# Patient Record
Sex: Female | Born: 1990 | Race: White | Hispanic: No | Marital: Married | Smoking: Never smoker
Health system: Southern US, Community
[De-identification: ages and names within clinical notes are randomized; demographics above are authoritative.]

## PROBLEM LIST (undated history)

## (undated) DIAGNOSIS — O09299 Supervision of pregnancy with other poor reproductive or obstetric history, unspecified trimester: Secondary | ICD-10-CM

## (undated) DIAGNOSIS — E7800 Pure hypercholesterolemia, unspecified: Secondary | ICD-10-CM

## (undated) DIAGNOSIS — Z8489 Family history of other specified conditions: Secondary | ICD-10-CM

## (undated) DIAGNOSIS — F419 Anxiety disorder, unspecified: Secondary | ICD-10-CM

## (undated) DIAGNOSIS — K219 Gastro-esophageal reflux disease without esophagitis: Secondary | ICD-10-CM

## (undated) HISTORY — PX: APPENDECTOMY: SHX54

## (undated) HISTORY — PX: FRACTURE SURGERY: SHX138

---
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
## 2013-10-05 DIAGNOSIS — F411 Generalized anxiety disorder: Secondary | ICD-10-CM | POA: Insufficient documentation

## 2014-01-30 DIAGNOSIS — F314 Bipolar disorder, current episode depressed, severe, without psychotic features: Secondary | ICD-10-CM | POA: Insufficient documentation

## 2015-06-26 ENCOUNTER — Encounter (HOSPITAL_COMMUNITY): Payer: Self-pay | Admitting: *Deleted

## 2015-06-26 ENCOUNTER — Emergency Department (HOSPITAL_COMMUNITY)
Admission: EM | Admit: 2015-06-26 | Discharge: 2015-06-26 | Disposition: A | Discharge status: Home / Self Care | Payer: Self-pay | Attending: Emergency Medicine | Admitting: Emergency Medicine

## 2015-06-26 DIAGNOSIS — L252 Unspecified contact dermatitis due to dyes: Secondary | ICD-10-CM | POA: Insufficient documentation

## 2015-06-26 DIAGNOSIS — Z77098 Contact with and (suspected) exposure to other hazardous, chiefly nonmedicinal, chemicals: Secondary | ICD-10-CM

## 2015-06-26 DIAGNOSIS — Z3202 Encounter for pregnancy test, result negative: Secondary | ICD-10-CM | POA: Insufficient documentation

## 2015-06-26 DIAGNOSIS — N760 Acute vaginitis: Secondary | ICD-10-CM | POA: Insufficient documentation

## 2015-06-26 DIAGNOSIS — B9689 Other specified bacterial agents as the cause of diseases classified elsewhere: Secondary | ICD-10-CM

## 2015-06-26 DIAGNOSIS — R200 Anesthesia of skin: Secondary | ICD-10-CM | POA: Insufficient documentation

## 2015-06-26 DIAGNOSIS — Z8639 Personal history of other endocrine, nutritional and metabolic disease: Secondary | ICD-10-CM | POA: Insufficient documentation

## 2015-06-26 DIAGNOSIS — Z8659 Personal history of other mental and behavioral disorders: Secondary | ICD-10-CM | POA: Insufficient documentation

## 2015-06-26 HISTORY — DX: Pure hypercholesterolemia, unspecified: E78.00

## 2015-06-26 HISTORY — DX: Anxiety disorder, unspecified: F41.9

## 2015-06-26 LAB — COMPREHENSIVE METABOLIC PANEL
ALT: 12 U/L — ABNORMAL LOW (ref 14–54)
ANION GAP: 11 (ref 5–15)
AST: 14 U/L — ABNORMAL LOW (ref 15–41)
Albumin: 4.8 g/dL (ref 3.5–5.0)
Alkaline Phosphatase: 47 U/L (ref 38–126)
BUN: 10 mg/dL (ref 6–20)
CALCIUM: 9.6 mg/dL (ref 8.9–10.3)
CHLORIDE: 106 mmol/L (ref 101–111)
CO2: 22 mmol/L (ref 22–32)
Creatinine, Ser: 0.72 mg/dL (ref 0.44–1.00)
GFR calc non Af Amer: >60 mL/min (ref >60)
Glucose, Bld: 109 mg/dL — ABNORMAL HIGH (ref 65–99)
POTASSIUM: 3.6 mmol/L (ref 3.5–5.1)
SODIUM: 139 mmol/L (ref 135–145)
Total Bilirubin: 1.3 mg/dL — ABNORMAL HIGH (ref 0.3–1.2)
Total Protein: 7.1 g/dL (ref 6.5–8.1)

## 2015-06-26 LAB — WET PREP, GENITAL
SPERM: NONE SEEN
TRICH WET PREP: NONE SEEN
Yeast Wet Prep HPF POC: NONE SEEN

## 2015-06-26 LAB — CBC
HCT: 40.6 % (ref 36.0–46.0)
Hemoglobin: 13.7 g/dL (ref 12.0–15.0)
MCH: 31.4 pg (ref 26.0–34.0)
MCHC: 33.7 g/dL (ref 30.0–36.0)
MCV: 92.9 fL (ref 78.0–100.0)
PLATELETS: 216 10*3/uL (ref 150–400)
RBC: 4.37 MIL/uL (ref 3.87–5.11)
RDW: 12.4 % (ref 11.5–15.5)
WBC: 11.1 10*3/uL — AB (ref 4.0–10.5)

## 2015-06-26 LAB — URINALYSIS, ROUTINE W REFLEX MICROSCOPIC
Bilirubin Urine: NEGATIVE
Glucose, UA: NEGATIVE mg/dL
Hgb urine dipstick: NEGATIVE
KETONES UR: NEGATIVE mg/dL
LEUKOCYTES UA: NEGATIVE
NITRITE: NEGATIVE
PROTEIN: NEGATIVE mg/dL
Specific Gravity, Urine: 1.007 (ref 1.005–1.030)
pH: 6.5 (ref 5.0–8.0)

## 2015-06-26 LAB — I-STAT BETA HCG BLOOD, ED (MC, WL, AP ONLY)

## 2015-06-26 MED ORDER — METRONIDAZOLE 500 MG PO TABS: 500.0000 mg | ORAL_TABLET | Freq: Two times a day (BID) | ORAL | Status: DC

## 2015-06-26 NOTE — ED Provider Notes (Signed)
CSN: [id number]     Arrival date & time 06/26/15  1526 History   First MD Initiated Contact with Patient 06/26/15 1537     Chief Complaint  Patient presents with  . Dizziness  . Numbness     (Consider location/radiation/quality/duration/timing/severity/associated sxs/prior Treatment) Patient is a 24 y.o. female presenting with dizziness. The history is provided by the patient. No language interpreter was used.  Dizziness Quality:  Lightheadedness Severity:  Moderate Onset quality:  Gradual Timing:  Constant Progression:  Worsening Chronicity:  New Context: not with head movement   Relieved by:  Nothing Worsened by:  Nothing Ineffective treatments:  None tried Associated symptoms: no chest pain, no headaches and no shortness of breath   Risk factors: no anemia   Pt has a history of anxiety.  Pt recently started on 2 new medications.  Pt reports today her hands are blue and numb.    Past Medical History  Diagnosis Date  . Anxiety   . High cholesterol    History reviewed. No pertinent past surgical history. History reviewed. No pertinent family history. Social History  Substance Use Topics  . Smoking status: Never Smoker   . Smokeless tobacco: None  . Alcohol Use: No   OB History    No data available     Review of Systems  Respiratory: Negative for shortness of breath.   Cardiovascular: Negative for chest pain.  Neurological: Positive for dizziness. Negative for headaches.  All other systems reviewed and are negative.     Allergies  Review of patient's allergies indicates no known allergies.  Home Medications   Prior to Admission medications   Not on File   BP 129/77 mmHg  Pulse 110  Temp(Src) 98.4 F (36.9 C) (Oral)  Resp 18  Ht  (1.727 m)  Wt 61.689 kg  BMI 20.68 kg/m2  SpO2 100%  LMP 06/19/2015 Physical Exam  Constitutional: She is oriented to person, place, and time. She appears well-developed and well-nourished.  HENT:  Head:  Normocephalic and atraumatic.  Right Ear: External ear normal.  Left Ear: External ear normal.  Nose: Nose normal.  Mouth/Throat: Oropharynx is clear and moist.  Eyes: Conjunctivae and EOM are normal. Pupils are equal, round, and reactive to light.  Neck: Normal range of motion.  Cardiovascular: Normal rate, regular rhythm and normal heart sounds.   Pulmonary/Chest: Effort normal and breath sounds normal.  Abdominal: She exhibits no distension.  Genitourinary: Uterus normal. Vaginal discharge found.  White discharge  Musculoskeletal: Normal range of motion.  Neurological: She is alert and oriented to person, place, and time.  Skin: Skin is warm.  Blue ink removed with alcohol swab,   Matches indigo  Color of blue jeans  Psychiatric: She has a normal mood and affect.  Nursing note and vitals reviewed.   ED Course  Pt also ask to be checked for yeast.   Procedures (including critical care time) Labs Review Labs Reviewed  CBC - Abnormal; Notable for the following:    WBC 11.1 (*)    All other components within normal limits  URINALYSIS, ROUTINE W REFLEX MICROSCOPIC (NOT AT [facility])  COMPREHENSIVE METABOLIC PANEL  I-STAT BETA HCG BLOOD, ED ([facility], [facility], [facility] ONLY)    Imaging Review No results found. I have personally reviewed and evaluated these images and lab results as part of my medical decision-making.   EKG Interpretation None      MDM   Final diagnoses:  Contact with dyes  BV (bacterial vaginosis)  Flagyl Pt advised to follow up with her Physician for recheck.      Lonia SkinnerLeslie K East Rocky HillSofia, PA-C 06/26/15 Ernestina [id number]  Lyndal Pulleyaniel Knott, MD 06/27/15 [phone]

## 2015-06-26 NOTE — ED Notes (Signed)
Patient is wanting to know if she can be checked for a yeast infection, because she is concerned about her continuous urinary symptoms.  Will make PA aware.

## 2015-06-26 NOTE — ED Notes (Signed)
Pt reports feeling weak and lightheaded for extended amount of time, pt was recently feeling bad and went to pcp and started on two medications for anxiety. Reports today having numbness to bilateral hands and now hands appear discolored but warm to touch.

## 2015-06-26 NOTE — ED Notes (Addendum)
Patient up to restroom.  Gait steady and even.   

## 2015-06-26 NOTE — ED Notes (Signed)
Patient able to dress and ambulate independently 

## 2015-06-26 NOTE — Discharge Instructions (Signed)

## 2020-11-04 ENCOUNTER — Other Ambulatory Visit: Payer: Self-pay | Admitting: Certified Nurse Midwife

## 2020-11-04 MED ORDER — DOXYLAMINE-PYRIDOXINE 10-10 MG PO TBEC: 1.0000 | DELAYED_RELEASE_TABLET | Freq: Four times a day (QID) | ORAL | 5 refills | Status: DC

## 2020-11-04 NOTE — Progress Notes (Signed)
Pt c/o nausea and is requesting medication to help manage. Orders placed.   Doreene Burke, CNM

## 2020-11-19 ENCOUNTER — Encounter: Payer: Self-pay | Admitting: Certified Nurse Midwife

## 2020-11-19 ENCOUNTER — Ambulatory Visit (INDEPENDENT_AMBULATORY_CARE_PROVIDER_SITE_OTHER): Payer: 59 | Admitting: Certified Nurse Midwife

## 2020-11-19 ENCOUNTER — Other Ambulatory Visit: Payer: Self-pay

## 2020-11-19 VITALS — BP 112/65 | HR 103 | Resp 16 | Ht 68.0 in | Wt 144.0 lb

## 2020-11-19 DIAGNOSIS — Z3402 Encounter for supervision of normal first pregnancy, second trimester: Secondary | ICD-10-CM

## 2020-11-19 DIAGNOSIS — Z3A22 22 weeks gestation of pregnancy: Secondary | ICD-10-CM | POA: Diagnosis not present

## 2020-11-19 DIAGNOSIS — Z3200 Encounter for pregnancy test, result unknown: Secondary | ICD-10-CM

## 2020-11-19 LAB — POCT URINALYSIS DIPSTICK OB
Appearance: NEGATIVE
Bilirubin, UA: NEGATIVE
Blood, UA: NEGATIVE
Glucose, UA: NEGATIVE
Ketones, UA: NEGATIVE
Leukocytes, UA: NEGATIVE
Nitrite, UA: NEGATIVE
POC,PROTEIN,UA: NEGATIVE
Spec Grav, UA: 1.01 (ref 1.010–1.025)
Urobilinogen, UA: 0.2 E.U./dL
pH, UA: 7 (ref 5.0–8.0)

## 2020-11-19 LAB — POCT URINE PREGNANCY: Preg Test, Ur: POSITIVE — AB

## 2020-11-19 MED ORDER — ONDANSETRON 4 MG PO TBDP: 4.0000 mg | ORAL_TABLET | Freq: Three times a day (TID) | ORAL | 0 refills | Status: DC | PRN

## 2020-11-19 NOTE — Patient Instructions (Signed)
https://www.[employer].org/womens-health/faqs/prenatal-genetic-screening-tests">  Prenatal Care Prenatal care is health care during pregnancy. It helps you and your unborn baby (fetus) stay as healthy as possible. Prenatal care may be provided by a midwife, a family practice doctor, a mid-level practitioner (nurse practitioner or physician assistant), or a childbirth and pregnancy doctor (obstetrician). How does this affect me? During pregnancy, you will be closely monitored for any new conditions that might develop. To lower your risk of pregnancy complications, you and your health care provider will talk about any underlying conditions you have. How does this affect my baby? Early and consistent prenatal care increases the chance that your baby will be healthy during pregnancy. Prenatal care lowers the risk that your baby will be:  Born early (prematurely).  Smaller than expected at birth (small for gestational age). What can I expect at the first prenatal care visit? Your first prenatal care visit will likely be the longest. You should schedule your first prenatal care visit as soon as you know that you are pregnant. Your first visit is a good time to talk about any questions or concerns you have about pregnancy. Medical history At your visit, you and your health care provider will talk about your medical history, including:  Any past pregnancies.  Your family's medical history.  Medical history of the baby's father.  Any long-term (chronic) health conditions you have and how you manage them.  Any surgeries or procedures you have had.  Any current over-the-counter or prescription medicines, herbs, or supplements that you are taking.  Other factors that could pose a risk to your baby, including: ? Exposure to harmful chemicals or radiation at work or at home. ? Any substance use, including tobacco, alcohol, and drug use.  Your home setting and your stress levels, including: ? Exposure to  abuse or violence. ? Household financial strain.  Your daily health habits, including diet and exercise. Tests and screenings Your health care provider will:  Measure your weight, height, and blood pressure.  Do a physical exam, including a pelvic and breast exam.  Perform blood tests and urine tests to check for: ? Urinary tract infection. ? Sexually transmitted infections (STIs). ? Low iron levels in your blood (anemia). ? Blood type and certain proteins on red blood cells (Rh antibodies). ? Infections and immunity to viruses, such as hepatitis B and rubella. ? HIV (human immunodeficiency virus).  Discuss your options for genetic screening. Tips about staying healthy Your health care provider will also give you information about how to keep yourself and your baby healthy, including:  Nutrition and taking vitamins.  Physical activity.  How to manage pregnancy symptoms such as nausea and vomiting (morning sickness).  Infections and substances that may be harmful to your baby and how to avoid them.  Food safety.  Dental care.  Working.  Travel.  Warning signs to watch for and when to call your health care provider. How often will I have prenatal care visits? After your first prenatal care visit, you will have regular visits throughout your pregnancy. The visit schedule is often as follows:  Up to week 28 of pregnancy: once every 4 weeks.  28-36 weeks: once every 2 weeks.  After 36 weeks: every week until delivery. Some women may have visits more or less often depending on any underlying health conditions and the health of the baby. Keep all follow-up and prenatal care visits. This is important. What happens during routine prenatal care visits? Your health care provider will:  Measure your weight   and blood pressure.  Check for fetal heart sounds.  Measure the height of your uterus in your abdomen (fundal height). This may be measured starting around week 20 of  pregnancy.  Check the position of your baby inside your uterus.  Ask questions about your diet, sleeping patterns, and whether you can feel the baby move.  Review warning signs to watch for and signs of labor.  Ask about any pregnancy symptoms you are having and how you are dealing with them. Symptoms may include: ? Headaches. ? Nausea and vomiting. ? Vaginal discharge. ? Swelling. ? Fatigue. ? Constipation. ? Changes in your vision. ? Feeling persistently sad or anxious. ? Any discomfort, including back or pelvic pain. ? Bleeding or spotting. Make a list of questions to ask your health care provider at your routine visits.   What tests might I have during prenatal care visits? You may have blood, urine, and imaging tests throughout your pregnancy, such as:  Urine tests to check for glucose, protein, or signs of infection.  Glucose tests to check for a form of diabetes that can develop during pregnancy (gestational diabetes mellitus). This is usually done around week 24 of pregnancy.  Ultrasounds to check your baby's growth and development, to check for birth defects, and to check your baby's well-being. These can also help to decide when you should deliver your baby.  A test to check for group B strep (GBS) infection. This is usually done around week 36 of pregnancy.  Genetic testing. This may include blood, fluid, or tissue sampling, or imaging tests, such as an ultrasound. Some genetic tests are done during the first trimester and some are done during the second trimester. What else can I expect during prenatal care visits? Your health care provider may recommend getting certain vaccines during pregnancy. These may include:  A yearly flu shot (annual influenza vaccine). This is especially important if you will be pregnant during flu season.  Tdap (tetanus, diphtheria, pertussis) vaccine. Getting this vaccine during pregnancy can protect your baby from whooping cough  (pertussis) after birth. This vaccine may be recommended between weeks 27 and 36 of pregnancy.  A COVID-19 vaccine. Later in your pregnancy, your health care provider may give you information about:  Childbirth and breastfeeding classes.  Choosing a health care provider for your baby.  Umbilical cord banking.  Breastfeeding.  Birth control after your baby is born.  The hospital labor and delivery unit and how to set up a tour.  Registering at the hospital before you go into labor. Where to find more information  Office on Women's Health: womenshealth.gov  American Pregnancy Association: americanpregnancy.org  March of Dimes: marchofdimes.org Summary  Prenatal care helps you and your baby stay as healthy as possible during pregnancy.  Your first prenatal care visit will most likely be the longest.  You will have visits and tests throughout your pregnancy to monitor your health and your baby's health.  Bring a list of questions to your visits to ask your health care provider.  Make sure to keep all follow-up and prenatal care visits. This information is not intended to replace advice given to you by your health care provider. Make sure you discuss any questions you have with your health care provider. Document Revised: 04/04/2020 Document Reviewed: 04/04/2020 Elsevier Patient Education  2021 Elsevier Inc.  

## 2020-11-19 NOTE — Progress Notes (Signed)
Subjective:    Krista Hancock is a 30 y.o. female who presents for evaluation of amenorrhea. She believes she could be pregnant. Pregnancy is desired. Sexual Activity: single partner, contraception: none. Current symptoms also include: nausea. Last period was normal.   Patient's last menstrual period was 09/23/2020 (exact date). The following portions of the patient's history were reviewed and updated as appropriate: allergies, current medications, past family history, past medical history, past social history, past surgical history and problem list.  Review of Systems Pertinent items are noted in HPI.     Objective:    BP 112/65   Pulse (!) 103   Resp 16   Ht 5\' 8"  (1.727 m)   Wt 144 lb (65.3 kg)   LMP 09/23/2020 (Exact Date)   BMI 21.90 kg/m  General: alert, cooperative, appears stated age, no distress and no acute distress    Lab Review Urine HCG: positive    Assessment:    Absence of menstruation.     Plan:   Positive: EDC: 06/30/21 Briefly discussed pre-natal care options. MD and midwifery care reviewed. Desires to see midwives. Encouraged well-balanced diet, plenty of rest when needed, pre-natal vitamins daily and walking for exercise. Discussed self-help for nausea, avoiding OTC medications until consulting provider or pharmacist, other than Tylenol as needed, minimal caffeine (1-2 cups daily) and avoiding alcohol. She will schedule her nurse visit in 2 wks, her initial NOB visit in 4 wks. Orders placed for zofran for nausea, pt states diclegis not working.   07/02/21, CNM

## 2020-12-11 ENCOUNTER — Other Ambulatory Visit: Payer: Self-pay

## 2020-12-11 ENCOUNTER — Ambulatory Visit
Admission: RE | Admit: 2020-12-11 | Discharge: 2020-12-11 | Disposition: A | Discharge status: Home / Self Care | Payer: Self-pay | Source: Ambulatory Visit | Attending: Certified Nurse Midwife | Admitting: Certified Nurse Midwife

## 2020-12-11 DIAGNOSIS — Z3200 Encounter for pregnancy test, result unknown: Secondary | ICD-10-CM | POA: Insufficient documentation

## 2020-12-12 ENCOUNTER — Ambulatory Visit (INDEPENDENT_AMBULATORY_CARE_PROVIDER_SITE_OTHER): Payer: 59

## 2020-12-12 VITALS — BP 134/72 | HR 101 | Ht 68.0 in | Wt 147.7 lb

## 2020-12-12 DIAGNOSIS — Z0283 Encounter for blood-alcohol and blood-drug test: Secondary | ICD-10-CM | POA: Diagnosis not present

## 2020-12-12 DIAGNOSIS — Z3481 Encounter for supervision of other normal pregnancy, first trimester: Secondary | ICD-10-CM | POA: Diagnosis not present

## 2020-12-12 DIAGNOSIS — Z113 Encounter for screening for infections with a predominantly sexual mode of transmission: Secondary | ICD-10-CM | POA: Diagnosis not present

## 2020-12-12 DIAGNOSIS — Z3A11 11 weeks gestation of pregnancy: Secondary | ICD-10-CM

## 2020-12-12 NOTE — Progress Notes (Signed)
      Krista Hancock presents for NOB nurse intake visit. Pregnancy confirmation done at [facility], 11/19/2020, with Doreene Burke.  G 2.  P 1001.  LMP 09/23/2020.  EDD 06/30/2021.  [place] [redacted]w[redacted]d. Pregnancy education material explained and given.  0 cats/ 2 dogs in the home.  NOB labs ordered. BMI less than 30. TSH/HbgA1c not ordered. Sickle cell not ordered due to race. HIV and drug screen explained and ordered. Genetic screening discussed. Genetic testing: pt is aware that genetic testing will be completed during the NOB PE.  Pt to discuss genetic testing with provider. PNV encouraged. Pt to follow up with provider in 1 weeks for NOB physical.     Pt wants to get the MaterniT21 done at [facility] PE with provider.

## 2020-12-12 NOTE — Patient Instructions (Signed)
WHAT OB PATIENTS CAN EXPECT  Confirmation of pregnancy and ultrasound ordered if medically indicated-[redacted] weeks gestation New OB (NOB) intake with nurse and New OB (NOB) labs- [redacted] weeks gestation New OB (NOB) physical examination with provider- 11/[redacted] weeks gestation Flu vaccine-[redacted] weeks gestation Anatomy scan-[redacted] weeks gestation Glucose tolerance test, blood work to test for anemia, T-dap vaccine-[redacted] weeks gestation Vaginal swabs/cultures-STD/Group B strep-[redacted] weeks gestation Appointments every 4 weeks until 28 weeks Every 2 weeks from 28 weeks until 36 weeks Weekly visits from 36 weeks until delivery  Tests and Screening During Pregnancy Having certain tests and screenings during pregnancy is an important part of your prenatal care. These tests help your health care provider find problems that might affect your pregnancy. Some tests must be done for all pregnant women, and some are optional. Most of the tests and screenings do not pose any risks for you or your baby. You may need additional testing if any routine tests indicate a problem. Tests and screenings done early in pregnancy Some tests and screenings you can expect to have in early pregnancy include: Blood tests, such as: Complete blood count (CBC). This test is done to check your red and white blood cells. It can help identify a risk for anemia, infection, or bleeding. Blood typing. This test shows your blood type. It also shows whether you have a certain protein in your red blood cells called the Rh factor. It can be dangerous for your baby if you do not have this protein (Rh negative) and your baby has it (Rh positive). Tests to check for diseases that can cause birth defects or can be passed to your baby, such as: [place] measles (rubella) and chicken pox. The test indicates whether you are immune to these diseases. Hepatitis B and C. Human Immunodeficiency Virus (HIV). Syphilis. Zika virus, if you or your partner has traveled to an area  where the virus occurs. Urine testing. This checks for sugar in your urine and for signs of infection. Blood pressure. This is to check for high blood pressure and preeclampsia. Testing for sexually transmitted infections (STIs), such as chlamydia or gonorrhea. Testing for tuberculosis. You may have this skin test if you are at risk for tuberculosis. Fetal ultrasound. This is an imaging study of your growing baby. It uses sound waves to create pictures of your baby. This test may be done to help determine your due date and to ensure you do not have an ectopic pregnancy. An ectopic pregnancy is a pregnancy that grows outside of the uterus.   Tests and screenings done later in pregnancy Certain tests are done for the first time later in the pregnancy. Some of the tests that were done in early pregnancy are repeated at this time. Some common tests you can expect to have later in pregnancy include: Rh antibody testing. If you are Rh negative, you will have a blood test at about 28 weeks of pregnancy to see if you are producing Rh antibodies. If you have not started to make antibodies, you will be given an injection to prevent you from making antibodies for the rest of your pregnancy. Glucose screening. This checks your blood sugar. It will show whether you are developing the type of diabetes that occurs during pregnancy (gestational diabetes). You may have this screening earlier if you have risk factors for diabetes. Screening for group B streptococcus (GBS). GBS is a type of bacteria that may live in your rectum or vagina. GBS can spread to your baby  during birth. This is done at 35-37 weeks of pregnancy. If testing is positive for GBS, you may be treated with antibiotic medicine. Urine and blood tests to monitor for other pregnancy problems, such as preeclampsia or anemia. Blood pressure to monitor for high blood pressure and preeclampsia. Fetal ultrasound. This may be repeated at 16-20 weeks to check how  your baby is growing and developing. Non-stress test. This test is done later in pregnancy to check your baby's heart rate. This may be repeated weekly if your pregnancy is high risk. Biophysical profile. This test includes ultrasound imaging and a non-stress test to ensure your baby is healthy. This test may help decide when your baby should be born.   Screening for birth defects Some birth defects are caused by abnormal genes passed down through families. Early in your pregnancy, tests can be done to find out if your baby is at risk for a genetic disorder. This testing is optional. The type of testing recommended for you will depend on your family and medical history, your ethnicity, and your age. Testing may include: Screening tests. These tests may include an ultrasound, blood tests, or a combination of both. The blood tests are used to check for abnormal genes and the ultrasound is done to look for early birth defects. Carrier screening. This test involves checking the blood or saliva of both parents to see if they carry abnormal genes that could be passed down to a baby. If genetic screening shows that your baby is at risk for a genetic defect, additional diagnostic testing may be recommended, such as: Amniocentesis. This involves testing a sample of fluid from your womb (amniotic fluid). Chorionic villus sampling. In this test, a sample of cells from your placenta is checked for abnormal cells. Unlike other tests done during pregnancy, diagnostic testing does have some risk for your pregnancy. Talk to your health care provider about the risks and benefits of genetic testing. Questions to ask your health care provider What routine tests are recommended for me? When and how will these tests be done? When will I get the results of routine tests? What do the results of these tests mean for me or my baby? Do you recommend any genetic screening tests? Which ones? Should I see a genetic counselor  before having genetic screening? Where to find more information American Pregnancy Association: americanpregnancy.org/prenatal-testing [employer]nec[employer]: JewelryExec.com.pt Office on [employer] Health: KeywordPortfolios.com.br March of Dimes: marchofdimes.org/pregnancy Summary Having certain tests and screenings during pregnancy is an important part of your prenatal care. Talk to your health care provider about what tests are right for you and your baby. In early pregnancy, testing may be done to check your risks for various conditions that can affect you and your baby. Later in pregnancy, tests may be done to ensure that your baby is growing normally and that you and your baby are staying healthy during the pregnancy. Genetic testing is optional. Talk to your health care provider about the risks and benefits of genetic testing. This information is not intended to replace advice given to you by your health care provider. Make sure you discuss any questions you have with your health care provider. Document Revised: 03/12/2020 Document Reviewed: 03/12/2020 Elsevier Patient Education  2021 [employer]. Morning Sickness  Morning sickness is when you feel like you may vomit (feel nauseous) during pregnancy. Sometimes, you may vomit. Morning sickness most often happens in the morning, but it can also happen at any time of  the day. Some women may have morning sickness that makes them vomit all the time. This is a more serious problem that needs treatment. What are the causes? The cause of this condition is not known. What increases the risk? You had vomiting or a feeling like you may vomit before your pregnancy. You had morning sickness in another pregnancy. You are pregnant with more than one baby, such as twins. What are the signs or symptoms? Feeling like you may vomit. Vomiting. How is this treated? Treatment is usually not needed for this condition. You may only  need to change what you eat. In some cases, your doctor may give you some things to take for your condition. These include: Vitamin B6 supplements. Medicines to treat the feeling that you may vomit. Ginger. Follow these instructions at home: Medicines Take over-the-counter and prescription medicines only as told by your doctor. Do not take any medicines until you talk with your doctor about them first. Take multivitamins before you get pregnant. These can stop or lessen the symptoms of morning sickness. Eating and drinking Eat dry toast or crackers before getting out of bed. Eat 5 or 6 small meals a day. Eat dry and bland foods like rice and baked potatoes. Do not eat greasy, fatty, or spicy foods. Have someone cook for you if the smell of food causes you to vomit or to feel like you may vomit. If you feel like you may vomit after taking prenatal vitamins, take them at night or with a snack. Eat protein foods when you need a snack. Nuts, yogurt, and cheese are good choices. Drink fluids throughout the day. Try ginger ale made with real ginger, ginger tea made from fresh grated ginger, or ginger candies. General instructions Do not smoke or use any products that contain nicotine or tobacco. If you need help quitting, ask your doctor. Use an air purifier to keep the air in your house free of smells. Get lots of fresh air. Try to avoid smells that make you feel sick. Try wearing an acupressure wristband. This is a wristband that is used to treat seasickness. Try a treatment called acupuncture. In this treatment, a doctor puts needles into certain areas of your body to make you feel better. Contact a doctor if: You need medicine to feel better. You feel dizzy or light-headed. You are losing weight. Get help right away if: The feeling that you may vomit will not go away, or you cannot stop vomiting. You faint. You have very bad pain in your belly. Summary Morning sickness is when you  feel like you may vomit (feel nauseous) during pregnancy. You may feel sick in the morning, but you can feel this way at any time of the day. Making some changes to what you eat may help your symptoms go away. This information is not intended to replace advice given to you by your health care provider. Make sure you discuss any questions you have with your health care provider. Document Revised: 02/05/2020 Document Reviewed: 01/15/2020 Elsevier Patient Education  2021 [employer]. How a Baby Grows During Pregnancy Pregnancy begins when a female's sperm enters a female's egg. This is called fertilization. Fertilization usually happens in one of the fallopian tubes that connect the ovaries to the uterus. The fertilized egg moves down the fallopian tube to the uterus. Once it reaches the uterus, it implants into the lining of the uterus and begins to grow. For the first 8 weeks, the fertilized egg is called an  embryo. After 8 weeks, it is called a fetus. As the fetus continues to grow, it receives oxygen and nutrients through the placenta, which is an organ that grows to support the developing baby. The placenta is the life support system for the baby. It provides oxygen and nutrition and removes waste. How long does a typical pregnancy last? A pregnancy usually lasts 280 days, or about 40 weeks. Pregnancy is divided into three periods of growth, also called trimesters: First trimester: 0-12 weeks. Second trimester: 13-27 weeks. Third trimester: 28-40 weeks. The day when your baby is ready to be born (full term) is your estimated date of delivery. However, most babies are not born on their estimated date of delivery. How does my baby develop month by month? First month The fertilized egg attaches to the inside of the uterus. Some cells will form the placenta. Others will form the fetus. The arms, legs, brain, spinal cord, lungs, and heart begin to develop. At the end of the first month, the heart  begins to beat. Second month The bones, inner ear, eyelids, hands, and feet form. The genitals develop. By the end of 8 weeks, all major organs are developing. Third month All of the internal organs are forming. Teeth develop below the gums. Bones and muscles begin to grow. The spine can flex. The skin is transparent. Fingernails and toenails begin to form. Arms and legs continue to grow longer, and hands and feet develop. The fetus is about 3 inches (7.6 cm) long. Fourth month The placenta is completely formed. The external sex organs, neck, outer ear, eyebrows, eyelids, and fingernails are formed. The fetus can hear, swallow, and move its arms and legs. The kidneys begin to produce urine. The skin is covered with a white, waxy coating (vernix) and very fine hair (lanugo). Fifth month The fetus moves around more and can be felt for the first time (quickening). The fetus starts to sleep and wake up and may begin to suck a finger. The nails grow to the end of the fingers. The organ in the digestive system that makes bile (gallbladder) functions and helps to digest nutrients. If the fetus is a female, eggs are present in the ovaries. If the fetus is a female, testicles start to move down into the scrotum. Sixth month The lungs are formed. The eyes open. The brain continues to develop. Your baby has fingerprints and toe prints. Your baby's hair grows thicker. At the end of the second trimester, the fetus is about 9 inches (22.9 cm) long. Seventh month The fetus kicks and stretches. The eyes are developed enough to sense changes in light. The hands can make a grasping motion. The fetus responds to sound. Eighth month Most organs and body systems are fully developed and functioning. Bones harden, and taste buds develop. The fetus may hiccup. Certain areas of the brain are still developing. The skull remains soft. Ninth month The fetus gains about  lb (0.23 kg) each week. The lungs  are fully developed. Patterns of sleep develop. The fetus's head typically moves into a head-down position (vertex) in the uterus to prepare for birth. The fetus weighs 6-9 lb (2.72-4.08 kg) and is 19-20 inches (48.26-50.8 cm) long.   How do I know if my baby is developing well? Always talk with your health care provider about any concerns that you may have about your pregnancy and your baby. At each prenatal visit, your health care provider will do several different tests to check on your health  and keep track of your baby's development. These include: Fundal height and position. To do this, your health care provider will: Measure your growing belly from your pubic bone to the top of the uterus using a tape measure. Feel your belly to determine your baby's position. Heartbeat. An ultrasound in the first trimester can confirm pregnancy and show a heartbeat, depending on how far along you are. Your health care provider will check your baby's heart rate at every prenatal visit. You will also have a second trimester ultrasound to check your baby's development. Follow these instructions at home: Take prenatal vitamins as told by your health care provider. These include vitamins such as folic acid, iron, calcium, and vitamin D. They are important for healthy development. Take over-the-counter and prescription medicines only as told by your health care provider. Keep all follow-up visits. This is important. Follow-up visits include prenatal care and screening tests. Summary A pregnancy usually lasts 280 days, or about 40 weeks. Pregnancy is divided into three periods of growth, also called trimesters. Your health care provider will monitor your baby's growth and development throughout your pregnancy. Follow your health care provider's recommendations about taking prenatal vitamins and medicines during your pregnancy. Talk with your health care provider if you have any concerns about your pregnancy or  your developing baby. This information is not intended to replace advice given to you by your health care provider. Make sure you discuss any questions you have with your health care provider. Document Revised: 11/29/2019 Document Reviewed: 10/05/2019 Elsevier Patient Education  2021 [employer]. [email or web address].com.cy.html">  First Trimester of Pregnancy  The first trimester of pregnancy starts on the first day of your last menstrual period until the end of week 12. This is also called months 1 through 3 of pregnancy. Body changes during your first trimester Your body goes through many changes during pregnancy. The changes usually return to normal after your baby is born. Physical changes You may gain or lose weight. Your breasts may grow larger and hurt. The area around your nipples may get darker. Dark spots or blotches may develop on your face. You may have changes in your hair. Health changes You may feel like you might vomit (nauseous), and you may vomit. You may have heartburn. You may have headaches. You may have trouble pooping (constipation). Your gums may bleed. Other changes You may get tired easily. You may pee (urinate) more often. Your menstrual periods will stop. You may not feel hungry. You may want to eat certain kinds of food. You may have changes in your emotions from day to day. You may have more dreams. Follow these instructions at home: Medicines Take over-the-counter and prescription medicines only as told by your doctor. Some medicines are not safe during pregnancy. Take a prenatal vitamin that contains at least 600 micrograms (mcg) of folic acid. Eating and drinking Eat healthy meals that include: Fresh fruits and vegetables. Whole grains. Good sources of protein, such as meat, eggs, or tofu. Low-fat dairy products. Avoid raw meat and unpasteurized juice, milk, and cheese. If you feel like you may vomit, or you vomit: Eat 4 or 5  small meals a day instead of 3 large meals. Try eating a few soda crackers. Drink liquids between meals instead of during meals. You may need to take these actions to prevent or treat trouble pooping: Drink enough fluids to keep your pee (urine) pale yellow. Eat foods that are high in fiber. These include beans, whole grains, and fresh fruits  and vegetables. Limit foods that are high in fat and sugar. These include fried or sweet foods. Activity Exercise only as told by your doctor. Most people can do their usual exercise routine during pregnancy. Stop exercising if you have cramps or pain in your lower belly (abdomen) or low back. Do not exercise if it is too hot or too humid, or if you are in a place of great height (high altitude). Avoid heavy lifting. If you choose to, you may have sex unless your doctor tells you not to. Relieving pain and discomfort Wear a good support bra if your breasts are sore. Rest with your legs raised (elevated) if you have leg cramps or low back pain. If you have bulging veins (varicose veins) in your legs: Wear support hose as told by your doctor. Raise your feet for 15 minutes, 3-4 times a day. Limit salt in your food. Safety Wear your seat belt at all times when you are in a car. Talk with your doctor if someone is hurting you or yelling at you. Talk with your doctor if you are feeling sad or have thoughts of hurting yourself. Lifestyle Do not use hot tubs, steam rooms, or saunas. Do not douche. Do not use tampons or scented sanitary pads. Do not use herbal medicines, illegal drugs, or medicines that are not approved by your doctor. Do not drink alcohol. Do not smoke or use any products that contain nicotine or tobacco. If you need help quitting, ask your doctor. Avoid cat litter boxes and soil that is used by cats. These carry germs that can cause harm to the baby and can cause a loss of your baby by miscarriage or stillbirth. General  instructions Keep all follow-up visits. This is important. Ask for help if you need counseling or if you need help with nutrition. Your doctor can give you advice or tell you where to go for help. Visit your dentist. At home, brush your teeth with a soft toothbrush. Floss gently. Write down your questions. Take them to your prenatal visits. Where to find more information American Pregnancy Association: americanpregnancy.org [employer]ians [employer]: www.acog.org Office on Women's Health: KeywordPortfolios.com.br Contact a doctor if: You are dizzy. You have a fever. You have mild cramps or pressure in your lower belly. You have a nagging pain in your belly area. You continue to feel like you may vomit, you vomit, or you have watery poop (diarrhea) for 24 hours or longer. You have a bad-smelling fluid coming from your vagina. You have pain when you pee. You are exposed to a disease that spreads from person to person, such as chickenpox, measles, Zika virus, HIV, or hepatitis. Get help right away if: You have spotting or bleeding from your vagina. You have very bad belly cramping or pain. You have shortness of breath or chest pain. You have any kind of injury, such as from a fall or a car crash. You have new or increased pain, swelling, or redness in an arm or leg. Summary The first trimester of pregnancy starts on the first day of your last menstrual period until the end of week 12 (months 1 through 3). Eat 4 or 5 small meals a day instead of 3 large meals. Do not smoke or use any products that contain nicotine or tobacco. If you need help quitting, ask your doctor. Keep all follow-up visits. This information is not intended to replace advice given to you by your health care provider. Make sure you  discuss any questions you have with your health care provider. Document Revised: 11/29/2019 Document Reviewed: 10/05/2019 Elsevier Patient Education  2021  [place]. Commonly Asked Questions During Pregnancy  Cats: A parasite can be excreted in cat feces.  To avoid exposure you need to have another person empty the little box.  If you must empty the litter box you will need to wear gloves.  Wash your hands after handling your cat.  This parasite can also be found in raw or undercooked meat so this should also be avoided.  Colds, Sore Throats, Flu: Please check your medication sheet to see what you can take for symptoms.  If your symptoms are unrelieved by these medications please call the office.  Dental Work: Most any dental work Investment banker, corporate recommends is permitted.  X-rays should only be taken during the first trimester if absolutely necessary.  Your abdomen should be shielded with a lead apron during all x-rays.  Please notify your provider prior to receiving any x-rays.  Novocaine is fine; gas is not recommended.  If your dentist requires a note from [place] prior to dental work please call the office and we will provide one for you.  Exercise: Exercise is an important part of staying healthy during your pregnancy.  You may continue most exercises you were accustomed to prior to pregnancy.  Later in your pregnancy you will most likely notice you have difficulty with activities requiring balance like riding a bicycle.  It is important that you listen to your body and avoid activities that put you at a higher risk of falling.  Adequate rest and staying well hydrated are a must!  If you have questions about the safety of specific activities ask your provider.    Exposure to Children with illness: Try to avoid obvious exposure; report any symptoms to [place] when noted,  If you have chicken pos, red measles or mumps, you should be immune to these diseases.   Please do not take any vaccines while pregnant unless you have checked with your OB provider.  Fetal Movement: After 28 weeks we recommend you do "kick counts" twice daily.  Lie or sit down in a calm quiet  environment and count your baby movements "kicks".  You should feel your baby at least 10 times per hour.  If you have not felt 10 kicks within the first hour get up, walk around and have something sweet to eat or drink then repeat for an additional hour.  If count remains less than 10 per hour notify your provider.  Fumigating: Follow your pest control agent's advice as to how long to stay out of your home.  Ventilate the area well before re-entering.  Hemorrhoids:   Most over-the-counter preparations can be used during pregnancy.  Check your medication to see what is safe to use.  It is important to use a stool softener or fiber in your diet and to drink lots of liquids.  If hemorrhoids seem to be getting worse please call the office.   Hot Tubs:  Hot tubs Jacuzzis and saunas are not recommended while pregnant.  These increase your internal body temperature and should be avoided.  Intercourse:  Sexual intercourse is safe during pregnancy as long as you are comfortable, unless otherwise advised by your provider.  Spotting may occur after intercourse; report any bright red bleeding that is heavier than spotting.  Labor:  If you know that you are in labor, please go to the hospital.  If you are  unsure, please call the office and let us help you decide what to do.  Lifting, straining, etc:  If your job requires heavy lifting or straining please check with your provider for any limitations.  Generally, you should not lift items heavier than that you can lift simply with your hands and arms (no back muscles)  Painting:  Paint fumes do not harm your pregnancy, but may make you ill and should be avoided if possible.  Latex or water based paints have less odor than oils.  Use adequate ventilation while painting.  Permanents & Hair Color:  Chemicals in hair dyes are not recommended as they cause increase hair dryness which can increase hair loss during pregnancy.  " Highlighting" and permanents are allowed.   Dye may be absorbed differently and permanents may not hold as well during pregnancy.  Sunbathing:  Use a sunscreen, as skin burns easily during pregnancy.  Drink plenty of fluids; avoid over heating.  Tanning Beds:  Because their possible side effects are still unknown, tanning beds are not recommended.  Ultrasound Scans:  Routine ultrasounds are performed at approximately 20 weeks.  You will be able to see your baby's general anatomy an if you would like to know the gender this can usually be determined as well.  If it is questionable when you conceived you may also receive an ultrasound early in your pregnancy for dating purposes.  Otherwise ultrasound exams are not routinely performed unless there is a medical necessity.  Although you can request a scan we ask that you pay for it when conducted because insurance does not cover " patient request" scans.  Work: If your pregnancy proceeds without complications you may work until your due date, unless your physician or employer advises otherwise.  Round Ligament Pain/Pelvic Discomfort:  Sharp, shooting pains not associated with bleeding are fairly common, usually occurring in the second trimester of pregnancy.  They tend to be worse when standing up or when you remain standing for long periods of time.  These are the result of pressure of certain pelvic ligaments called "round ligaments".  Rest, Tylenol and heat seem to be the most effective relief.  As the womb and fetus grow, they rise out of the pelvis and the discomfort improves.  Please notify the office if your pain seems different than that described.  It may represent a more serious condition.  Common Medications Safe in Pregnancy  Acne:      Constipation:  Benzoyl Peroxide     Colace  Clindamycin      Dulcolax Suppository  Topica Erythromycin     Fibercon  Salicylic Acid      Metamucil         Miralax AVOID:        Senakot   Accutane    Cough:  Retin-A       Cough  Drops  Tetracycline      Phenergan w/ Codeine if Rx  Minocycline      Robitussin (Plain & DM)  Antibiotics:     Crabs/Lice:  Ceclor       RID  Cephalosporins    AVOID:  E-Mycins      Kwell  Keflex  Macrobid/Macrodantin   Diarrhea:  Penicillin      Kao-Pectate  Zithromax      Imodium AD         PUSH FLUIDS AVOID:       Cipro     Fever:  Tetracycline  Tylenol (Regular or Extra  Minocycline       Strength)  Levaquin      Extra Strength-Do not          Exceed 8 tabs/24 hrs Caffeine:        <293m/day (equiv. To 1 cup of coffee or  approx. 3 12 oz sodas)         Gas: Cold/Hayfever:       Gas-X  Benadryl      Mylicon  Claritin       Phazyme  **Claritin-D        Chlor-Trimeton    Headaches:  Dimetapp      ASA-Free Excedrin  Drixoral-Non-Drowsy     Cold Compress  Mucinex (Guaifenasin)     Tylenol (Regular or Extra  Sudafed/Sudafed-12 Hour     Strength)  **Sudafed PE Pseudoephedrine   Tylenol Cold & Sinus     Vicks Vapor Rub  Zyrtec  **AVOID if Problems With Blood Pressure         Heartburn: Avoid lying down for at least 1 hour after meals  Aciphex      Maalox     Rash:  Milk of Magnesia     Benadryl    Mylanta       1% Hydrocortisone Cream  Pepcid  Pepcid Complete   Sleep Aids:  Prevacid      Ambien   Prilosec       Benadryl  Rolaids       Chamomile Tea  Tums (Limit 4/day)     Unisom         Tylenol PM         Warm milk-add vanilla or  Hemorrhoids:       Sugar for taste  Anusol/Anusol H.C.  (RX: Analapram 2.5%)  Sugar Substitutes:  Hydrocortisone OTC     Ok in moderation  Preparation H      Tucks        Vaseline lotion applied to tissue with wiping    Herpes:     Throat:  Acyclovir      Oragel  Famvir  Valtrex     Vaccines:         Flu Shot Leg Cramps:       *Gardasil  Benadryl      Hepatitis A         Hepatitis B Nasal Spray:       Pneumovax  Saline Nasal Spray     Polio Booster         Tetanus Nausea:       Tuberculosis test or PPD  Vitamin  B6 25 mg TID   AVOID:    Dramamine      *Gardasil  Emetrol       Live Poliovirus  Ginger Root 250 mg QID    MMR (measles, mumps &  High Complex Carbs @ Bedtime    rebella)  Sea Bands-Accupressure    Varicella (Chickenpox)  Unisom 1/2 tab TID     *No known complications           If received before Pain:         Known pregnancy;   Darvocet       Resume series after  Lortab        Delivery  Percocet    Yeast:   Tramadol      Femstat  Tylenol 3      Gyne-lotrimin  Ultram       Monistat  Vicodin           MISC:         All Sunscreens           Hair Coloring/highlights          Insect Repellant's          (Including DEET)         Mystic Tans

## 2020-12-13 LAB — VIRAL HEPATITIS HBV, HCV
HCV Ab: <0.1 s/co ratio (ref 0.0–0.9)
Hep B Core Total Ab: NEGATIVE
Hep B Surface Ab, Qual: NONREACTIVE
Hepatitis B Surface Ag: NEGATIVE

## 2020-12-13 LAB — URINALYSIS, ROUTINE W REFLEX MICROSCOPIC
Bilirubin, UA: NEGATIVE
Glucose, UA: NEGATIVE
Ketones, UA: NEGATIVE
Leukocytes,UA: NEGATIVE
Nitrite, UA: NEGATIVE
RBC, UA: NEGATIVE
Specific Gravity, UA: 1.022 (ref 1.005–1.030)
Urobilinogen, Ur: 1 mg/dL (ref 0.2–1.0)
pH, UA: 6.5 (ref 5.0–7.5)

## 2020-12-13 LAB — DRUG PROFILE, UR, 9 DRUGS (LABCORP)
Amphetamines, Urine: NEGATIVE ng/mL
Barbiturate Quant, Ur: NEGATIVE ng/mL
Benzodiazepine Quant, Ur: NEGATIVE ng/mL
Cannabinoid Quant, Ur: NEGATIVE ng/mL
Cocaine (Metab.): NEGATIVE ng/mL
Methadone Screen, Urine: NEGATIVE ng/mL
Opiate Quant, Ur: NEGATIVE ng/mL
PCP Quant, Ur: NEGATIVE ng/mL
Propoxyphene: NEGATIVE ng/mL

## 2020-12-13 LAB — ABO AND RH: Rh Factor: POSITIVE

## 2020-12-13 LAB — NICOTINE SCREEN, URINE: Cotinine Ql Scrn, Ur: NEGATIVE ng/mL

## 2020-12-13 LAB — VARICELLA ZOSTER ANTIBODY, IGG: Varicella zoster IgG: 1141 index (ref >165)

## 2020-12-13 LAB — HIV ANTIBODY (ROUTINE TESTING W REFLEX): HIV Screen 4th Generation wRfx: NONREACTIVE

## 2020-12-13 LAB — ANTIBODY SCREEN: Antibody Screen: NEGATIVE

## 2020-12-13 LAB — RUBELLA SCREEN: Rubella Antibodies, IGG: 7.38 index (ref >0.99)

## 2020-12-13 LAB — RPR: RPR Ser Ql: NONREACTIVE

## 2020-12-13 LAB — HCV INTERPRETATION

## 2020-12-14 LAB — CULTURE, OB URINE

## 2020-12-14 LAB — GC/CHLAMYDIA PROBE AMP
Chlamydia trachomatis, NAA: NEGATIVE
Neisseria Gonorrhoeae by PCR: NEGATIVE

## 2020-12-14 LAB — URINE CULTURE, OB REFLEX

## 2020-12-17 ENCOUNTER — Ambulatory Visit (INDEPENDENT_AMBULATORY_CARE_PROVIDER_SITE_OTHER): Payer: 59 | Admitting: Certified Nurse Midwife

## 2020-12-17 ENCOUNTER — Encounter: Payer: Self-pay | Admitting: Certified Nurse Midwife

## 2020-12-17 ENCOUNTER — Other Ambulatory Visit: Payer: Self-pay | Admitting: Certified Nurse Midwife

## 2020-12-17 ENCOUNTER — Other Ambulatory Visit: Payer: Self-pay

## 2020-12-17 ENCOUNTER — Other Ambulatory Visit (HOSPITAL_COMMUNITY)
Admission: RE | Admit: 2020-12-17 | Discharge: 2020-12-17 | Disposition: A | Discharge status: Home / Self Care | Payer: 59 | Source: Ambulatory Visit | Attending: Certified Nurse Midwife | Admitting: Certified Nurse Midwife

## 2020-12-17 DIAGNOSIS — Z124 Encounter for screening for malignant neoplasm of cervix: Secondary | ICD-10-CM | POA: Diagnosis present

## 2020-12-17 DIAGNOSIS — Z3481 Encounter for supervision of other normal pregnancy, first trimester: Secondary | ICD-10-CM

## 2020-12-17 DIAGNOSIS — Z3A12 12 weeks gestation of pregnancy: Secondary | ICD-10-CM

## 2020-12-17 LAB — POCT URINALYSIS DIPSTICK
Bilirubin, UA: NEGATIVE
Blood, UA: NEGATIVE
Glucose, UA: NEGATIVE
Ketones, UA: NEGATIVE
Leukocytes, UA: NEGATIVE
Nitrite, UA: NEGATIVE
Protein, UA: NEGATIVE
Spec Grav, UA: 1.02 (ref 1.010–1.025)
Urobilinogen, UA: 0.2 E.U./dL
pH, UA: 6.5 (ref 5.0–8.0)

## 2020-12-17 MED ORDER — ASPIRIN EC 81 MG PO TBEC: 81.0000 mg | DELAYED_RELEASE_TABLET | Freq: Every day | ORAL | 11 refills | Status: DC

## 2020-12-17 NOTE — Patient Instructions (Signed)
[email or web address]  Prenatal Care Prenatal care is health care during pregnancy. It helps you and your unborn baby (fetus) stay as healthy as possible. Prenatal care may be provided by a midwife, a family practice doctor, a mid-level practitioner (nurse practitioner or physician assistant), or a childbirth and pregnancy doctor (obstetrician). How does this affect me? During pregnancy, you will be closely monitored for any new conditions that might develop. To lower your risk of pregnancy complications, you and yourhealth care provider will talk about any underlying conditions you have. How does this affect my baby? Early and consistent prenatal care increases the chance that your baby will be healthy during pregnancy. Prenatal care lowers the risk that your baby will be: Born early (prematurely). Smaller than expected at birth (small for gestational age). What can I expect at the first prenatal care visit? Your first prenatal care visit will likely be the longest. You should schedule your first prenatal care visit as soon as you know that you are pregnant. Your first visit is a good time to talk about any questions or concerns you haveabout pregnancy. Medical history At your visit, you and your health care provider will talk about your medical history, including: Any past pregnancies. Your family's medical history. Medical history of the baby's father. Any long-term (chronic) health conditions you have and how you manage them. Any surgeries or procedures you have had. Any current over-the-counter or prescription medicines, herbs, or supplements that you are taking. Other factors that could pose a risk to your baby, including: Exposure to harmful chemicals or radiation at work or at home. Any substance use, including tobacco, alcohol, and drug use. Your home setting and your stress levels, including: Exposure to abuse or  violence. Household financial strain. Your daily health habits, including diet and exercise. Tests and screenings Your health care provider will: Measure your weight, height, and blood pressure. Do a physical exam, including a pelvic and breast exam. Perform blood tests and urine tests to check for: Urinary tract infection. Sexually transmitted infections (STIs). Low iron levels in your blood (anemia). Blood type and certain proteins on red blood cells (Rh antibodies). Infections and immunity to viruses, such as hepatitis B and rubella. HIV (human immunodeficiency virus). Discuss your options for genetic screening. Tips about staying healthy Your health care provider will also give you information about how to keep yourself and your baby healthy, including: Nutrition and taking vitamins. Physical activity. How to manage pregnancy symptoms such as nausea and vomiting (morning sickness). Infections and substances that may be harmful to your baby and how to avoid them. Food safety. Dental care. Working. Travel. Warning signs to watch for and when to call your health care provider. How often will I have prenatal care visits? After your first prenatal care visit, you will have regular visits throughout your pregnancy. The visit schedule is often as follows: Up to week 28 of pregnancy: once every 4 weeks. 28-36 weeks: once every 2 weeks. After 36 weeks: every week until delivery. Some women may have visits more or less often depending on any underlyinghealth conditions and the health of the baby. Keep all follow-up and prenatal care visits. This is important. What happens during routine prenatal care visits? Your health care provider will: Measure your weight and blood pressure. Check for fetal heart sounds. Measure the height of your uterus in your abdomen (fundal height). This may be measured starting around week 20 of pregnancy. Check the position of your baby inside your  uterus. Ask questions   about your diet, sleeping patterns, and whether you can feel the baby move. Review warning signs to watch for and signs of labor. Ask about any pregnancy symptoms you are having and how you are dealing with them. Symptoms may include: Headaches. Nausea and vomiting. Vaginal discharge. Swelling. Fatigue. Constipation. Changes in your vision. Feeling persistently sad or anxious. Any discomfort, including back or pelvic pain. Bleeding or spotting. Make a list of questions to ask your health care provider at your routinevisits. What tests might I have during prenatal care visits? You may have blood, urine, and imaging tests throughout your pregnancy, such as: Urine tests to check for glucose, protein, or signs of infection. Glucose tests to check for a form of diabetes that can develop during pregnancy (gestational diabetes mellitus). This is usually done around week 24 of pregnancy. Ultrasounds to check your baby's growth and development, to check for birth defects, and to check your baby's well-being. These can also help to decide when you should deliver your baby. A test to check for group B strep (GBS) infection. This is usually done around week 36 of pregnancy. Genetic testing. This may include blood, fluid, or tissue sampling, or imaging tests, such as an ultrasound. Some genetic tests are done during the first trimester and some are done during the second trimester. What else can I expect during prenatal care visits? Your health care provider may recommend getting certain vaccines during pregnancy. These may include: A yearly flu shot (annual influenza vaccine). This is especially important if you will be pregnant during flu season. Tdap (tetanus, diphtheria, pertussis) vaccine. Getting this vaccine during pregnancy can protect your baby from whooping cough (pertussis) after birth. This vaccine may be recommended between weeks 27 and 36 of pregnancy. A COVID-19  vaccine. Later in your pregnancy, your health care provider may give you information about: Childbirth and breastfeeding classes. Choosing a health care provider for your baby. Umbilical cord banking. Breastfeeding. Birth control after your baby is born. The hospital labor and delivery unit and how to set up a tour. Registering at the hospital before you go into labor. Where to find more information Office on Women's Health: womenshealth.gov American Pregnancy Association: americanpregnancy.org March of Dimes: marchofdimes.org Summary Prenatal care helps you and your baby stay as healthy as possible during pregnancy. Your first prenatal care visit will most likely be the longest. You will have visits and tests throughout your pregnancy to monitor your health and your baby's health. Bring a list of questions to your visits to ask your health care provider. Make sure to keep all follow-up and prenatal care visits. This information is not intended to replace advice given to you by your health care provider. Make sure you discuss any questions you have with your healthcare provider. Document Revised: 04/04/2020 Document Reviewed: 04/04/2020 Elsevier Patient Education  2022 Elsevier Inc.  

## 2020-12-17 NOTE — Progress Notes (Signed)
NEW OB HISTORY AND PHYSICAL  SUBJECTIVE:       Krista Hancock is a 30 y.o. [id number] female, Patient's last menstrual period was 09/23/2020 (exact date)., Estimated Date of Delivery: 06/30/21, [redacted]w[redacted]d, presents today for establishment of Prenatal Care. She has no unusual complaints.     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 Normal Contraception: none Last Pap: 11/18/2016. Results were: normal  Obstetric History OB History  Gravida Para Term Preterm AB Living  2 1 1     1   SAB IAB Ectopic Multiple Live Births          1    # Outcome Date GA Lbr Len/2nd Weight Sex Delivery Anes PTL Lv  2 Current           1 Term 06/02/19   5 lb 10 oz (2.551 kg) F CS-Unspec   LIV    Past Medical History:  Diagnosis Date   Anxiety    High cholesterol     No past surgical history on file.  Current Outpatient Medications on File Prior to Visit  Medication Sig Dispense Refill   Doxylamine-Pyridoxine 10-10 MG TBEC Take 1 tablet by mouth 4 (four) times daily. Day 1 &2: 2 tablet at bedtimeDay 3 : if symptoms persists 1 tablet am; 2 tablet at bedtimeDay 4: 1 tablet am, 1 tab afternoon, 2 tab at bedtime 120 tablet 5   ondansetron (ZOFRAN-ODT) 4 MG disintegrating tablet Take 1 tablet (4 mg total) by mouth every 8 (eight) hours as needed for nausea or vomiting. 20 tablet 0   Prenatal Vit-Fe Fumarate-FA (PRENATAL MULTIVITAMIN) TABS tablet Take 1 tablet by mouth daily at 12 noon.     No current facility-administered medications on file prior to visit.    No Known Allergies  Social History   Socioeconomic History   Marital status: Single    Spouse name: Not on file   Number of children: Not on file   Years of education: Not on file   Highest education level: Not on file  Occupational History    Not on file  Tobacco Use   Smoking status: Never   Smokeless tobacco: Never  Vaping Use   Vaping Use: Never used  Substance and Sexual Activity   Alcohol use: No   Drug use: No   Sexual activity: Yes    Birth control/protection: None  Other Topics Concern   Not on file  Social History Narrative   Not on file   Social Determinants of Health   Financial Resource Strain: Not on file  Food Insecurity: Not on file  Transportation Needs: Not on file  Physical Activity: Not on file  Stress: Not on file  Social Connections: Not on file  Intimate Partner Violence: Not on file    Family History  Problem Relation Age of Onset   Hypertension Mother    Colon polyps Mother    Hypertension Father    Healthy Brother    Colon polyps Maternal Grandmother    Hypertension Paternal Grandmother    Stroke Paternal Grandmother    Bladder Cancer Paternal Grandfather     The following portions of the patient's history were reviewed and updated as appropriate: allergies, current medications, past OB history, past medical history, past surgical history, past family history, past social history, and problem list.  OBJECTIVE: Initial Physical Exam (New OB)  GENERAL APPEARANCE: alert, well appearing, in no apparent distress, oriented to person, place and time HEAD: normocephalic, atraumatic MOUTH: mucous membranes moist, pharynx normal without lesions THYROID: no thyromegaly or masses present BREASTS: no masses noted, no significant tenderness, no palpable axillary nodes, no skin changes LUNGS: clear to auscultation, no wheezes, rales or rhonchi, symmetric air entry HEART: regular rate and rhythm, no murmurs ABDOMEN: soft, nontender, nondistended, no abnormal masses, no epigastric pain EXTREMITIES: no redness or tenderness in the calves or thighs SKIN: normal coloration and turgor, no rashes LYMPH NODES: no adenopathy palpable NEUROLOGIC: alert, oriented, normal speech, no focal  findings or movement disorder noted  PELVIC EXAM EXTERNAL GENITALIA: normal appearing vulva with no masses, tenderness or lesions VAGINA: no abnormal discharge or lesions CERVIX: no lesions or cervical motion tenderness, pap collected UTERUS: gravid ADNEXA: no masses palpable and nontender OB EXAM PELVIMETRY: appears adequate RECTUM: exam not indicated  ASSESSMENT: Normal pregnancy  PLAN: New OB counseling: The patient has been given an overview regarding routine prenatal care. Recommendations regarding diet, weight gain, and exercise in pregnancy were given. Prenatal testing, optional genetic testing, carrier screening, and ultrasound use in pregnancy were reviewed.  Benefits of Breast Feeding were discussed. The patient is encouraged to consider nursing her baby post partum. ROB 4 wks   Pattricia Boss ThompsonCNM

## 2020-12-18 LAB — CBC
Hematocrit: 35.9 % (ref 34.0–46.6)
Hemoglobin: 11.9 g/dL (ref 11.1–15.9)
MCH: 31.1 pg (ref 26.6–33.0)
MCHC: 33.1 g/dL (ref 31.5–35.7)
MCV: 94 fL (ref 79–97)
Platelets: 191 10*3/uL (ref 150–450)
RBC: 3.83 x10E6/uL (ref 3.77–5.28)
RDW: 12.3 % (ref 11.7–15.4)
WBC: 11.5 10*3/uL — ABNORMAL HIGH (ref 3.4–10.8)

## 2020-12-21 LAB — MATERNIT 21 PLUS CORE, BLOOD
Fetal Fraction: 10
Result (T21): NEGATIVE
Trisomy 13 (Patau syndrome): NEGATIVE
Trisomy 18 (Edwards syndrome): NEGATIVE
Trisomy 21 (Down syndrome): NEGATIVE

## 2020-12-25 LAB — CYTOLOGY - PAP
Comment: NEGATIVE
Diagnosis: NEGATIVE
High risk HPV: NEGATIVE

## 2021-01-14 ENCOUNTER — Ambulatory Visit (INDEPENDENT_AMBULATORY_CARE_PROVIDER_SITE_OTHER): Payer: 59 | Admitting: Certified Nurse Midwife

## 2021-01-14 ENCOUNTER — Other Ambulatory Visit: Payer: Self-pay

## 2021-01-14 VITALS — BP 119/69 | HR 92 | Wt 151.3 lb

## 2021-01-14 DIAGNOSIS — Z3A16 16 weeks gestation of pregnancy: Secondary | ICD-10-CM

## 2021-01-14 DIAGNOSIS — Z3481 Encounter for supervision of other normal pregnancy, first trimester: Secondary | ICD-10-CM

## 2021-01-14 LAB — POCT URINALYSIS DIPSTICK OB
Bilirubin, UA: NEGATIVE
Blood, UA: NEGATIVE
Glucose, UA: NEGATIVE
Ketones, UA: NEGATIVE
Leukocytes, UA: NEGATIVE
Nitrite, UA: NEGATIVE
POC,PROTEIN,UA: NEGATIVE
Spec Grav, UA: 1.01 (ref 1.010–1.025)
Urobilinogen, UA: 0.2 E.U./dL
pH, UA: 6.5 (ref 5.0–8.0)

## 2021-01-14 NOTE — Patient Instructions (Signed)
Round Ligament Pain  The round ligament is a cord of muscle and tissue that helps support the uterus. It can become a source of pain during pregnancy if it becomes stretched or twisted as the baby grows. The pain usually begins in the second trimester (13-28 weeks) of pregnancy, and it can come and go until the baby is delivered.It is not a serious problem, and it does not cause harm to the baby. Round ligament pain is usually a short, sharp, and pinching pain, but it can also be a dull, lingering, and aching pain. The pain is felt in the lower side of the abdomen or in the groin. It usually starts deep in the groin and moves up to the outside of the hip area. The pain may occur when you: Suddenly change position, such as quickly going from a sitting to standing position. Roll over in bed. Cough or sneeze. Do physical activity. Follow these instructions at home:  Watch your condition for any changes. When the pain starts, relax. Then try any of these methods to help with the pain: Sitting down. Flexing your knees up to your abdomen. Lying on your side with one pillow under your abdomen and another pillow between your legs. Sitting in a warm bath for 15-20 minutes or until the pain goes away. Take over-the-counter and prescription medicines only as told by your health care provider. Move slowly when you sit down or stand up. Avoid long walks if they cause pain. Stop or reduce your physical activities if they cause pain. Keep all follow-up visits as told by your health care provider. This is important. Contact a health care provider if: Your pain does not go away with treatment. You feel pain in your back that you did not have before. Your medicine is not helping. Get help right away if: You have a fever or chills. You develop uterine contractions. You have vaginal bleeding. You have nausea or vomiting. You have diarrhea. You have pain when you urinate. Summary Round ligament pain is  felt in the lower abdomen or groin. It is usually a short, sharp, and pinching pain. It can also be a dull, lingering, and aching pain. This pain usually begins in the second trimester (13-28 weeks). It occurs because the uterus is stretching with the growing baby, and it is not harmful to the baby. You may notice the pain when you suddenly change position, when you cough or sneeze, or during physical activity. Relaxing, flexing your knees to your abdomen, lying on one side, or taking a warm bath may help to get rid of the pain. Get help from your health care provider if the pain does not go away or if you have vaginal bleeding, nausea, vomiting, diarrhea, or painful urination. This information is not intended to replace advice given to you by your health care provider. Make sure you discuss any questions you have with your healthcare provider. Document Revised: 06/07/2020 Document Reviewed: 12/08/2017 Elsevier Patient Education  2022 Elsevier Inc.  

## 2021-01-14 NOTE — Progress Notes (Signed)
ROB doing well, feels some fluttering not sure if it is movement or gas. Discussed nausea, states that it is getting better. Reviewed u/s for anatomy at 20 wks. Discussed MD appointment for pre operative. Follow up 4 wks with Pattricia Boss for ROB.

## 2021-01-27 ENCOUNTER — Ambulatory Visit
Admission: RE | Admit: 2021-01-27 | Discharge: 2021-01-27 | Disposition: A | Discharge status: Home / Self Care | Payer: 59 | Source: Ambulatory Visit | Attending: Certified Nurse Midwife | Admitting: Certified Nurse Midwife

## 2021-01-27 ENCOUNTER — Other Ambulatory Visit: Payer: Self-pay

## 2021-01-27 DIAGNOSIS — Z3A16 16 weeks gestation of pregnancy: Secondary | ICD-10-CM | POA: Diagnosis present

## 2021-01-27 DIAGNOSIS — Z3481 Encounter for supervision of other normal pregnancy, first trimester: Secondary | ICD-10-CM | POA: Insufficient documentation

## 2021-02-12 ENCOUNTER — Other Ambulatory Visit: Payer: Self-pay

## 2021-02-12 ENCOUNTER — Encounter: Payer: Self-pay | Admitting: Certified Nurse Midwife

## 2021-02-12 ENCOUNTER — Ambulatory Visit (INDEPENDENT_AMBULATORY_CARE_PROVIDER_SITE_OTHER): Payer: 59 | Admitting: Certified Nurse Midwife

## 2021-02-12 VITALS — BP 119/78 | HR 109 | Wt 159.6 lb

## 2021-02-12 DIAGNOSIS — Z3A20 20 weeks gestation of pregnancy: Secondary | ICD-10-CM

## 2021-02-12 DIAGNOSIS — Z3481 Encounter for supervision of other normal pregnancy, first trimester: Secondary | ICD-10-CM

## 2021-02-12 LAB — POCT URINALYSIS DIPSTICK OB
Bilirubin, UA: NEGATIVE
Blood, UA: NEGATIVE
Glucose, UA: NEGATIVE
Ketones, UA: NEGATIVE
Leukocytes, UA: NEGATIVE
Nitrite, UA: NEGATIVE
POC,PROTEIN,UA: NEGATIVE
Spec Grav, UA: 1.01 (ref 1.010–1.025)
Urobilinogen, UA: 0.2 E.U./dL
pH, UA: 6.5 (ref 5.0–8.0)

## 2021-02-12 NOTE — Progress Notes (Signed)
ROB doing well. Feeling movement. U/s results reviewed. Discussed round ligament pain and self help measures. All questions answered. Follow up 4 wk for ROB.   Doreene Burke, CNM

## 2021-02-12 NOTE — Patient Instructions (Signed)
Round Ligament Pain  The round ligament is a cord of muscle and tissue that helps support the uterus. It can become a source of pain during pregnancy if it becomes stretched or twisted as the baby grows. The pain usually begins in the second trimester (13-28 weeks) of pregnancy, and it can come and go until the baby is delivered.It is not a serious problem, and it does not cause harm to the baby. Round ligament pain is usually a short, sharp, and pinching pain, but it can also be a dull, lingering, and aching pain. The pain is felt in the lower side of the abdomen or in the groin. It usually starts deep in the groin and moves up to the outside of the hip area. The pain may occur when you: Suddenly change position, such as quickly going from a sitting to standing position. Roll over in bed. Cough or sneeze. Do physical activity. Follow these instructions at home:  Watch your condition for any changes. When the pain starts, relax. Then try any of these methods to help with the pain: Sitting down. Flexing your knees up to your abdomen. Lying on your side with one pillow under your abdomen and another pillow between your legs. Sitting in a warm bath for 15-20 minutes or until the pain goes away. Take over-the-counter and prescription medicines only as told by your health care provider. Move slowly when you sit down or stand up. Avoid long walks if they cause pain. Stop or reduce your physical activities if they cause pain. Keep all follow-up visits as told by your health care provider. This is important. Contact a health care provider if: Your pain does not go away with treatment. You feel pain in your back that you did not have before. Your medicine is not helping. Get help right away if: You have a fever or chills. You develop uterine contractions. You have vaginal bleeding. You have nausea or vomiting. You have diarrhea. You have pain when you urinate. Summary Round ligament pain is  felt in the lower abdomen or groin. It is usually a short, sharp, and pinching pain. It can also be a dull, lingering, and aching pain. This pain usually begins in the second trimester (13-28 weeks). It occurs because the uterus is stretching with the growing baby, and it is not harmful to the baby. You may notice the pain when you suddenly change position, when you cough or sneeze, or during physical activity. Relaxing, flexing your knees to your abdomen, lying on one side, or taking a warm bath may help to get rid of the pain. Get help from your health care provider if the pain does not go away or if you have vaginal bleeding, nausea, vomiting, diarrhea, or painful urination. This information is not intended to replace advice given to you by your health care provider. Make sure you discuss any questions you have with your healthcare provider. Document Revised: 06/07/2020 Document Reviewed: 12/08/2017 Elsevier Patient Education  2022 Elsevier Inc.  

## 2021-03-17 ENCOUNTER — Ambulatory Visit (INDEPENDENT_AMBULATORY_CARE_PROVIDER_SITE_OTHER): Payer: 59 | Admitting: Certified Nurse Midwife

## 2021-03-17 ENCOUNTER — Encounter: Payer: Self-pay | Admitting: Certified Nurse Midwife

## 2021-03-17 ENCOUNTER — Other Ambulatory Visit: Payer: Self-pay

## 2021-03-17 VITALS — BP 119/71 | HR 94 | Wt 160.6 lb

## 2021-03-17 DIAGNOSIS — Z23 Encounter for immunization: Secondary | ICD-10-CM

## 2021-03-17 DIAGNOSIS — Z3A25 25 weeks gestation of pregnancy: Secondary | ICD-10-CM

## 2021-03-17 DIAGNOSIS — Z3402 Encounter for supervision of normal first pregnancy, second trimester: Secondary | ICD-10-CM

## 2021-03-17 LAB — POCT URINALYSIS DIPSTICK OB
Bilirubin, UA: NEGATIVE
Blood, UA: NEGATIVE
Glucose, UA: NEGATIVE
Ketones, UA: NEGATIVE
Leukocytes, UA: NEGATIVE
Nitrite, UA: NEGATIVE
Spec Grav, UA: 1.015 (ref 1.010–1.025)
Urobilinogen, UA: 0.2 E.U./dL
pH, UA: 7 (ref 5.0–8.0)

## 2021-03-17 NOTE — Progress Notes (Signed)
ROB doing well, feeling good movement. Discussed glucose testing next visit. Information sheet given. Pt requesting labs results for school. Will follow up. Flu shot given today. Follow up 3 wks for glucose screen.   Doreene Burke, CNM

## 2021-03-17 NOTE — Progress Notes (Signed)
ROB: She is doing well, no concerns today. 

## 2021-03-17 NOTE — Patient Instructions (Signed)
Td (Tetanus, Diphtheria) Vaccine: What You Need to Know 1. Why get vaccinated? Td vaccine can prevent tetanus and diphtheria. Tetanus enters the body through cuts or wounds. Diphtheria spreads from person to person. TETANUS (T) causes painful stiffening of the muscles. Tetanus can lead to serious health problems, including being unable to open the mouth, having trouble swallowing and breathing, or death. DIPHTHERIA (D) can lead to difficulty breathing, heart failure, paralysis, or death. 2. Td vaccine Td is only for children 7 years and older, adolescents, and adults.  Td is usually given as a booster dose every 10 years, or after 5 years in the case of a severe or dirty wound or burn. Another vaccine, called "Tdap," may be used instead of Td. Tdap protects against pertussis, also known as "whooping cough," in addition to tetanus anddiphtheria. Td may be given at the same time as other vaccines. 3. Talk with your health care provider Tell your vaccination provider if the person getting the vaccine: Has had an allergic reaction after a previous dose of any vaccine that protects against tetanus or diphtheria, or has any severe, life-threatening allergies Has ever had Guillain-Barr Syndrome (also called "GBS") Has had severe pain or swelling after a previous dose of any vaccine that protects against tetanus or diphtheria In some cases, your health care provider may decide to postpone Td vaccinationuntil a future visit. People with minor illnesses, such as a cold, may be vaccinated. People who are moderately or severely ill should usually wait until they recover beforegetting Td vaccine.  Your health care provider can give you more information. 4. Risks of a vaccine reaction Pain, redness, or swelling where the shot was given, mild fever, headache, feeling tired, and nausea, vomiting, diarrhea, or stomachache sometimes happen after Td vaccination. People sometimes faint after medical procedures,  including vaccination. Tellyour provider if you feel dizzy or have vision changes or ringing in the ears.  As with any medicine, there is a very remote chance of a vaccine causing asevere allergic reaction, other serious injury, or death. 5. What if there is a serious problem? An allergic reaction could occur after the vaccinated person leaves the clinic. If you see signs of a severe allergic reaction (hives, swelling of the face and throat, difficulty breathing, a fast heartbeat, dizziness, or weakness), call [id number] get the person to the nearest hospital.  For other signs that concern you, call your health care provider.  Adverse reactions should be reported to the Vaccine Adverse Event Reporting System (VAERS). Your health care provider will usually file this report, or you can do it yourself. Visit the VAERS website at www.vaers.hhs.gov or call 1-800-822-7967. VAERS is only for reporting reactions, and VAERS staff members do not give medical advice. 6. The National Vaccine Injury Compensation Program The National Vaccine Injury Compensation Program (VICP) is a federal program that was created to compensate people who may have been injured by certain vaccines. Claims regarding alleged injury or death due to vaccination have a time limit for filing, which may be as short as two years. Visit the VICP website at www.hrsa.gov/vaccinecompensation or call 1-800-338-2382to learn about the program and about filing a claim. 7. How can I learn more? Ask your health care provider. Call your local or state health department. Visit the website of the Food and Drug Administration (FDA) for vaccine package inserts and additional information at www.fda.gov/vaccines-blood-biologics/vaccines. Contact the Centers for Disease Control and Prevention (CDC): Call 1-800-232-4636 (1-800-CDC-INFO) or Visit CDC's website at www.cdc.gov/vaccines. Vaccine Information Statement   Td (Tetanus, Diphtheria) Vaccine (02/09/2020) This  information is not intended to replace advice given to you by your health care provider. Make sure you discuss any questions you have with your healthcare provider. Document Revised: 03/28/2020 Document Reviewed: 03/28/2020 Elsevier Patient Education  2022 Elsevier Inc. Oral Glucose Tolerance Test During Pregnancy Why am I having this test? The oral glucose tolerance test (OGTT) is done to check how your body processes blood sugar (glucose). This is one of several tests used to diagnose diabetes that develops during pregnancy (gestational diabetes mellitus). Gestational diabetes is a short-term form of diabetes that some women develop while they are pregnant. It usually occurs during the second trimesterof pregnancy and goes away after delivery. Testing, or screening, for gestational diabetes usually occurs at weeks 24-28 of pregnancy. You may have the OGTT test after having a 1-hour glucose screening test if the results from that test indicate that you may have gestational diabetes. This test may also be needed if: You have a history of gestational diabetes. There is a history of giving birth to very large babies or of losing pregnancies (having stillbirths). You have signs and symptoms of diabetes, such as: Changes in your eyesight. Tingling or numbness in your hands or feet. Changes in hunger, thirst, and urination, and these are not explained by your pregnancy. What is being tested? This test measures the amount of glucose in your blood at different timesduring a period of 3 hours. This shows how well your body can process glucose. What kind of sample is taken?  Blood samples are required for this test. They are usually collected byinserting a needle into a blood vessel. How do I prepare for this test? For 3 days before your test, eat normally. Have plenty of carbohydrate-rich foods. Follow instructions from your health care provider about: Eating or drinking restrictions on the day of the  test. You may be asked not to eat or drink anything other than water (to fast) starting 8-10 hours before the test. Changing or stopping your regular medicines. Some medicines may interfere with this test. Tell a health care provider about: All medicines you are taking, including vitamins, herbs, eye drops, creams, and over-the-counter medicines. Any blood disorders you have. Any surgeries you have had. Any medical conditions you have. What happens during the test? First, your blood glucose will be measured. This is referred to as your fasting blood glucose because you fasted before the test. Then, you will drink a glucose solution that contains a certain amount of glucose. Your blood glucosewill be measured again 1, 2, and 3 hours after you drink the solution. This test takes about 3 hours to complete. You will need to stay at the testing location during this time. During the testing period: Do not eat or drink anything other than the glucose solution. Do not exercise. Do not use any products that contain nicotine or tobacco, such as cigarettes, e-cigarettes, and chewing tobacco. These can affect your test results. If you need help quitting, ask your health care provider. The testing procedure may vary among health care providers and hospitals. How are the results reported? Your results will be reported as milligrams of glucose per deciliter of blood (mg/dL) or millimoles per liter (mmol/L). There is more than one source for screening and diagnosis reference values used to diagnose gestational diabetes. Your health care provider will compare your results to normal values that were established after testing a large group of people (reference values). Reference values may vary among labs   and hospitals. For this test (Carpenter-Coustan), reference values are: Fasting: 95 mg/dL (5.3 mmol/L). 1 hour: 180 mg/dL (10.0 mmol/L). 2 hour: 155 mg/dL (8.6 mmol/L). 3 hour: 140 mg/dL (7.8 mmol/L). What do the  results mean? Results below the reference values are considered normal. If two or more of your blood glucose levels are at or above the reference values, you may be diagnosed with gestational diabetes. If only one level is high, your healthcare provider may suggest repeat testing or other tests to confirm a diagnosis. Talk with your health care provider about what your results mean. Questions to ask your health care provider Ask your health care provider, or the department that is doing the test: When will my results be ready? How will I get my results? What are my treatment options? What other tests do I need? What are my next steps? Summary The oral glucose tolerance test (OGTT) is one of several tests used to diagnose diabetes that develops during pregnancy (gestational diabetes mellitus). Gestational diabetes is a short-term form of diabetes that some women develop while they are pregnant. You may have the OGTT test after having a 1-hour glucose screening test if the results from that test show that you may have gestational diabetes. You may also have this test if you have any symptoms or risk factors for this type of diabetes. Talk with your health care provider about what your results mean. This information is not intended to replace advice given to you by your health care provider. Make sure you discuss any questions you have with your healthcare provider. Document Revised: 11/30/2019 Document Reviewed: 11/30/2019 Elsevier Patient Education  2022 Elsevier Inc.  

## 2021-04-09 ENCOUNTER — Ambulatory Visit (INDEPENDENT_AMBULATORY_CARE_PROVIDER_SITE_OTHER): Payer: 59 | Admitting: Certified Nurse Midwife

## 2021-04-09 ENCOUNTER — Other Ambulatory Visit: Payer: Self-pay

## 2021-04-09 ENCOUNTER — Other Ambulatory Visit: Payer: 59

## 2021-04-09 VITALS — BP 114/73 | HR 85 | Wt 165.0 lb

## 2021-04-09 DIAGNOSIS — Z23 Encounter for immunization: Secondary | ICD-10-CM

## 2021-04-09 DIAGNOSIS — Z3483 Encounter for supervision of other normal pregnancy, third trimester: Secondary | ICD-10-CM

## 2021-04-09 DIAGNOSIS — Z3A28 28 weeks gestation of pregnancy: Secondary | ICD-10-CM

## 2021-04-09 DIAGNOSIS — Z3A25 25 weeks gestation of pregnancy: Secondary | ICD-10-CM

## 2021-04-09 LAB — POCT URINALYSIS DIPSTICK OB
Bilirubin, UA: NEGATIVE
Blood, UA: NEGATIVE
Glucose, UA: NEGATIVE
Ketones, UA: NEGATIVE
Leukocytes, UA: NEGATIVE
Nitrite, UA: NEGATIVE
Spec Grav, UA: 1.02 (ref 1.010–1.025)
Urobilinogen, UA: 0.2 E.U./dL
pH, UA: 7 (ref 5.0–8.0)

## 2021-04-09 MED ORDER — TETANUS-DIPHTH-ACELL PERTUSSIS 5-2.5-18.5 LF-MCG/0.5 IM SUSY
0.5000 mL | PREFILLED_SYRINGE | Freq: Once | INTRAMUSCULAR | Status: AC
  Administered 2021-04-09: 0.5 mL via INTRAMUSCULAR

## 2021-04-09 NOTE — Progress Notes (Signed)
ROB doing well. Feels good movement. 28 wk labs today: Glucose screen/RPR/CBC. Tdap, Blood transfusion consent completed, all questions answered. Ready set baby reviewed, see check list for topics covered. Sample birth plan given, will follow up in upcoming visits. Discussed birth control after delivery, information pamphlet given. She will meet with MD @ 32 wk appointment to discuss repeat c sections. She has u/s @ 32 wk for growth due to hx IUGR.   Follow up 2 wk for ROB or sooner if needed.    Doreene Burke, CNM

## 2021-04-09 NOTE — Patient Instructions (Signed)
Oral Glucose Tolerance Test During Pregnancy °Why am I having this test? °The oral glucose tolerance test (OGTT) is done to check how your body processes blood sugar (glucose). This is one of several tests used to diagnose diabetes that develops during pregnancy (gestational diabetes mellitus). Gestational diabetes is a short-term form of diabetes that some women develop while they are pregnant. It usually occurs during the second trimester of pregnancy and goes away after delivery. °Testing, or screening, for gestational diabetes usually occurs at weeks 24-28 of pregnancy. You may have the OGTT test after having a 1-hour glucose screening test if the results from that test indicate that you may have gestational diabetes. This test may also be needed if: °You have a history of gestational diabetes. °There is a history of giving birth to very large babies or of losing pregnancies (having stillbirths). °You have signs and symptoms of diabetes, such as: °Changes in your eyesight. °Tingling or numbness in your hands or feet. °Changes in hunger, thirst, and urination, and these are not explained by your pregnancy. °What is being tested? °This test measures the amount of glucose in your blood at different times during a period of 3 hours. This shows how well your body can process glucose. °What kind of sample is taken? °Blood samples are required for this test. They are usually collected by inserting a needle into a blood vessel. °How do I prepare for this test? °For 3 days before your test, eat normally. Have plenty of carbohydrate-rich foods. °Follow instructions from your health care provider about: °Eating or drinking restrictions on the day of the test. You may be asked not to eat or drink anything other than water (to fast) starting 8-10 hours before the test. °Changing or stopping your regular medicines. Some medicines may interfere with this test. °Tell a health care provider about: °All medicines you are taking,  including vitamins, herbs, eye drops, creams, and over-the-counter medicines. °Any blood disorders you have. °Any surgeries you have had. °Any medical conditions you have. °What happens during the test? °First, your blood glucose will be measured. This is referred to as your fasting blood glucose because you fasted before the test. Then, you will drink a glucose solution that contains a certain amount of glucose. Your blood glucose will be measured again 1, 2, and 3 hours after you drink the solution. °This test takes about 3 hours to complete. You will need to stay at the testing location during this time. During the testing period: °Do not eat or drink anything other than the glucose solution. °Do not exercise. °Do not use any products that contain nicotine or tobacco, such as cigarettes, e-cigarettes, and chewing tobacco. These can affect your test results. If you need help quitting, ask your health care provider. °The testing procedure may vary among health care providers and hospitals. °How are the results reported? °Your results will be reported as milligrams of glucose per deciliter of blood (mg/dL) or millimoles per liter (mmol/L). There is more than one source for screening and diagnosis reference values used to diagnose gestational diabetes. Your health care provider will compare your results to normal values that were established after testing a large group of people (reference values). Reference values may vary among labs and hospitals. For this test (Carpenter-Coustan), reference values are: °Fasting: 95 mg/dL (5.3 mmol/L). °1 hour: 180 mg/dL (10.0 mmol/L). °2 hour: 155 mg/dL (8.6 mmol/L). °3 hour: 140 mg/dL (7.8 mmol/L). °What do the results mean? °Results below the reference values are considered   normal. If two or more of your blood glucose levels are at or above the reference values, you may be diagnosed with gestational diabetes. If only one level is high, your health care provider may suggest  repeat testing or other tests to confirm a diagnosis. °Talk with your health care provider about what your results mean. °Questions to ask your health care provider °Ask your health care provider, or the department that is doing the test: °When will my results be ready? °How will I get my results? °What are my treatment options? °What other tests do I need? °What are my next steps? °Summary °The oral glucose tolerance test (OGTT) is one of several tests used to diagnose diabetes that develops during pregnancy (gestational diabetes mellitus). Gestational diabetes is a short-term form of diabetes that some women develop while they are pregnant. °You may have the OGTT test after having a 1-hour glucose screening test if the results from that test show that you may have gestational diabetes. You may also have this test if you have any symptoms or risk factors for this type of diabetes. °Talk with your health care provider about what your results mean. °This information is not intended to replace advice given to you by your health care provider. Make sure you discuss any questions you have with your health care provider. °Document Revised: 11/30/2019 Document Reviewed: 11/30/2019 °Elsevier Patient Education © 2022 Elsevier Inc. ° °

## 2021-04-10 LAB — CBC
Hematocrit: 36.2 % (ref 34.0–46.6)
Hemoglobin: 12.2 g/dL (ref 11.1–15.9)
MCH: 32.1 pg (ref 26.6–33.0)
MCHC: 33.7 g/dL (ref 31.5–35.7)
MCV: 95 fL (ref 79–97)
Platelets: 180 10*3/uL (ref 150–450)
RBC: 3.8 x10E6/uL (ref 3.77–5.28)
RDW: 12.8 % (ref 11.7–15.4)
WBC: 9.2 10*3/uL (ref 3.4–10.8)

## 2021-04-10 LAB — RPR: RPR Ser Ql: NONREACTIVE

## 2021-04-10 LAB — GLUCOSE, 1 HOUR GESTATIONAL: Gestational Diabetes Screen: 73 mg/dL (ref 65–139)

## 2021-04-25 ENCOUNTER — Other Ambulatory Visit: Payer: Self-pay

## 2021-04-25 ENCOUNTER — Ambulatory Visit (INDEPENDENT_AMBULATORY_CARE_PROVIDER_SITE_OTHER): Payer: 59 | Admitting: Certified Nurse Midwife

## 2021-04-25 VITALS — BP 113/70 | HR 72 | Wt 167.0 lb

## 2021-04-25 DIAGNOSIS — Z3A30 30 weeks gestation of pregnancy: Secondary | ICD-10-CM

## 2021-04-25 DIAGNOSIS — Z3483 Encounter for supervision of other normal pregnancy, third trimester: Secondary | ICD-10-CM

## 2021-04-25 LAB — POCT URINALYSIS DIPSTICK OB
Bilirubin, UA: NEGATIVE
Blood, UA: NEGATIVE
Glucose, UA: NEGATIVE
Ketones, UA: NEGATIVE
Leukocytes, UA: NEGATIVE
Nitrite, UA: NEGATIVE
POC,PROTEIN,UA: NEGATIVE
Spec Grav, UA: 1.01 (ref 1.010–1.025)
Urobilinogen, UA: 0.2 E.U./dL
pH, UA: 7.5 (ref 5.0–8.0)

## 2021-04-25 NOTE — Progress Notes (Signed)
ROB doing well, feeling good movement. Has appointment with Dr. Logan Bores at 32 wks for consult. She is requesting a repeat c/section. She also has u/s for growth due to history of IUGR. She state she feels bigger this pregnancy than she did with the last. Reassurance given. Discussed fundal measurement appropriate. Follow up as scheduled.   Doreene Burke, CNM

## 2021-04-25 NOTE — Patient Instructions (Signed)
[facility]  [facility]  [address], [place], [place] [address]  Phone: [phone]  [facility] (second location)  [address] [place], [place] [address]  Phone: [phone]  [facility] ([place]) [address], [place], [place] [address] Phone: [phone]  [facility]  [address] , [place] [address]  Phone: [phone] 

## 2021-05-05 ENCOUNTER — Other Ambulatory Visit: Payer: 59

## 2021-05-06 ENCOUNTER — Ambulatory Visit (INDEPENDENT_AMBULATORY_CARE_PROVIDER_SITE_OTHER): Payer: 59

## 2021-05-06 ENCOUNTER — Other Ambulatory Visit: Payer: Self-pay

## 2021-05-06 ENCOUNTER — Encounter: Payer: Self-pay | Admitting: Obstetrics and Gynecology

## 2021-05-06 ENCOUNTER — Ambulatory Visit (INDEPENDENT_AMBULATORY_CARE_PROVIDER_SITE_OTHER): Payer: 59 | Admitting: Obstetrics and Gynecology

## 2021-05-06 VITALS — BP 111/71 | HR 90 | Wt 169.1 lb

## 2021-05-06 DIAGNOSIS — Z3A32 32 weeks gestation of pregnancy: Secondary | ICD-10-CM

## 2021-05-06 DIAGNOSIS — Z3A25 25 weeks gestation of pregnancy: Secondary | ICD-10-CM | POA: Diagnosis not present

## 2021-05-06 DIAGNOSIS — Z3483 Encounter for supervision of other normal pregnancy, third trimester: Secondary | ICD-10-CM | POA: Diagnosis not present

## 2021-05-06 LAB — POCT URINALYSIS DIPSTICK OB
Bilirubin, UA: NEGATIVE
Blood, UA: NEGATIVE
Glucose, UA: NEGATIVE
Ketones, UA: NEGATIVE
Leukocytes, UA: NEGATIVE
Nitrite, UA: NEGATIVE
Spec Grav, UA: 1.01 (ref 1.010–1.025)
Urobilinogen, UA: 0.2 E.U./dL
pH, UA: 8 (ref 5.0–8.0)

## 2021-05-06 NOTE — Progress Notes (Signed)
ROB: Has no complaints today.  Reports daily fetal movement.  Taking aspirin as directed.  Ultrasound today shows baby at the 37th percentile. Patient has a history of growth restriction previous induction followed by cesarean delivery for failure to dilate.  She desires elective repeat.  Cesarean delivery scheduled for 12/19.  All questions regarding repeat cesarean delivery answered.

## 2021-05-06 NOTE — Progress Notes (Signed)
ROB: She is doing well, no concerns today. 

## 2021-05-20 ENCOUNTER — Other Ambulatory Visit: Payer: Self-pay

## 2021-05-20 ENCOUNTER — Ambulatory Visit (INDEPENDENT_AMBULATORY_CARE_PROVIDER_SITE_OTHER): Payer: 59 | Admitting: Certified Nurse Midwife

## 2021-05-20 ENCOUNTER — Encounter: Payer: Self-pay | Admitting: Certified Nurse Midwife

## 2021-05-20 VITALS — BP 115/75 | HR 84 | Wt 169.5 lb

## 2021-05-20 DIAGNOSIS — Z3483 Encounter for supervision of other normal pregnancy, third trimester: Secondary | ICD-10-CM

## 2021-05-20 DIAGNOSIS — Z3A34 34 weeks gestation of pregnancy: Secondary | ICD-10-CM

## 2021-05-20 LAB — POCT URINALYSIS DIPSTICK OB
Bilirubin, UA: NEGATIVE
Blood, UA: NEGATIVE
Glucose, UA: NEGATIVE
Ketones, UA: NEGATIVE
Leukocytes, UA: NEGATIVE
Nitrite, UA: NEGATIVE
POC,PROTEIN,UA: NEGATIVE
Spec Grav, UA: 1.015 (ref 1.010–1.025)
Urobilinogen, UA: 0.2 E.U./dL
pH, UA: 6.5 (ref 5.0–8.0)

## 2021-05-20 NOTE — Patient Instructions (Signed)
Fetal Movement Counts Patient Name: ________________________________________________ Patient DueDate: ____________________ What is a fetal movement count?  A fetal movement count is the number of times that you feel your baby move during a certain amount of time. This may also be called a fetal kick count. A fetal movement count is recommended for every pregnant woman. You may be askedto start counting fetal movements as early as week 28 of your pregnancy. Pay attention to when your baby is most active. You may notice your baby's sleep and wake cycles. You may also notice things that make your baby move more. You should do a fetal movement count: When your baby is normally most active. At the same time each day. A good time to count movements is while you are resting, after having somethingto eat and drink. How do I count fetal movements? Find a quiet, comfortable area. Sit, or lie down on your side. Write down the date, the start time and stop time, and the number of movements that you felt between those two times. Take this information with you to your health care visits. Write down your start time when you feel the first movement. Count kicks, flutters, swishes, rolls, and jabs. You should feel at least 10 movements. You may stop counting after you have felt 10 movements, or if you have been counting for 2 hours. Write down the stop time. If you do not feel 10 movements in 2 hours, contact your health care provider for further instructions. Your health care provider may want to do additional tests to assess your baby's well-being. Contact a health care provider if: You feel fewer than 10 movements in 2 hours. Your baby is not moving like he or she usually does. Date: ____________ Start time: ____________ Stop time: ____________ Movements:____________ Date: ____________ Start time: ____________ Stop time: ____________ Movements:____________ Date: ____________ Start time: ____________ Stop  time: ____________ Movements:____________ Date: ____________ Start time: ____________ Stop time: ____________ Movements:____________ Date: ____________ Start time: ____________ Stop time: ____________ Movements:____________ Date: ____________ Start time: ____________ Stop time: ____________ Movements:____________ Date: ____________ Start time: ____________ Stop time: ____________ Movements:____________ Date: ____________ Start time: ____________ Stop time: ____________ Movements:____________ Date: ____________ Start time: ____________ Stop time: ____________ Movements:____________ This information is not intended to replace advice given to you by your health care provider. Make sure you discuss any questions you have with your healthcare provider. Document Revised: 02/09/2019 Document Reviewed: 02/09/2019 Elsevier Patient Education  2022 Elsevier Inc.  

## 2021-05-20 NOTE — Progress Notes (Signed)
ROB doing well, feels good movement. Discussed GBS testing next visit. She verbalizes and agrees to plan. Reviewed u/s results. Discussed cesarean section. Pt requesting not to be restrained , reassurance given. FOB wants to cut cord

## 2021-06-03 ENCOUNTER — Other Ambulatory Visit: Payer: Self-pay

## 2021-06-03 ENCOUNTER — Encounter: Payer: Self-pay | Admitting: Certified Nurse Midwife

## 2021-06-03 ENCOUNTER — Ambulatory Visit (INDEPENDENT_AMBULATORY_CARE_PROVIDER_SITE_OTHER): Payer: 59 | Admitting: Certified Nurse Midwife

## 2021-06-03 VITALS — BP 109/75 | HR 91 | Wt 169.1 lb

## 2021-06-03 DIAGNOSIS — Z113 Encounter for screening for infections with a predominantly sexual mode of transmission: Secondary | ICD-10-CM

## 2021-06-03 DIAGNOSIS — Z3A35 35 weeks gestation of pregnancy: Secondary | ICD-10-CM

## 2021-06-03 DIAGNOSIS — Z3483 Encounter for supervision of other normal pregnancy, third trimester: Secondary | ICD-10-CM

## 2021-06-03 LAB — POCT URINALYSIS DIPSTICK OB
Bilirubin, UA: NEGATIVE
Blood, UA: NEGATIVE
Glucose, UA: NEGATIVE
Ketones, UA: NEGATIVE
Nitrite, UA: NEGATIVE
Spec Grav, UA: 1.02 (ref 1.010–1.025)
Urobilinogen, UA: 0.2 E.U./dL
pH, UA: 6.5 (ref 5.0–8.0)

## 2021-06-03 NOTE — Progress Notes (Signed)
ROB doing well. Feeling good movement. Feels like she is running out of room. GBS and cultures today.  Follow up 1 wk   Doreene Burke, CNM

## 2021-06-04 NOTE — Telephone Encounter (Signed)
I talked to her about this yesterday. This can occur if vaginal discharge gets into the urine sample. If she has it again next visit we can send her urine for culture or if she begins to have UTI symptoms then she can do a urine drop off.   Thanks  Pattricia Boss

## 2021-06-05 LAB — STREP GP B NAA: Strep Gp B NAA: NEGATIVE

## 2021-06-06 LAB — GC/CHLAMYDIA PROBE AMP
Chlamydia trachomatis, NAA: NEGATIVE
Neisseria Gonorrhoeae by PCR: NEGATIVE

## 2021-06-10 ENCOUNTER — Encounter: Payer: Self-pay | Admitting: Certified Nurse Midwife

## 2021-06-10 ENCOUNTER — Other Ambulatory Visit: Payer: Self-pay

## 2021-06-10 ENCOUNTER — Ambulatory Visit (INDEPENDENT_AMBULATORY_CARE_PROVIDER_SITE_OTHER): Payer: 59 | Admitting: Certified Nurse Midwife

## 2021-06-10 VITALS — BP 117/80 | HR 82 | Wt 170.6 lb

## 2021-06-10 DIAGNOSIS — Z3483 Encounter for supervision of other normal pregnancy, third trimester: Secondary | ICD-10-CM

## 2021-06-10 DIAGNOSIS — Z3A37 37 weeks gestation of pregnancy: Secondary | ICD-10-CM

## 2021-06-10 LAB — POCT URINALYSIS DIPSTICK OB
Leukocytes, UA: NEGATIVE
POC,PROTEIN,UA: NEGATIVE
Spec Grav, UA: <=1.005 — AB (ref 1.010–1.025)
pH, UA: 5 (ref 5.0–8.0)

## 2021-06-10 NOTE — Progress Notes (Signed)
Rob, doing well. Feeling regular movement. Discussed labor precautions. Discussed COVID testing Friday before c/seciton on Monday. She verbalizes understanding. Follow up 1 wk with Dr. Logan Bores.   Doreene Burke, CNM

## 2021-06-10 NOTE — Patient Instructions (Signed)
Braxton Hicks Contractions Contractions of the uterus can occur throughout pregnancy, but they are not always a sign that you are in labor. You may have practice contractions called Braxton Hicks contractions. These false labor contractions are sometimes confused with true labor. What are Braxton Hicks contractions? Braxton Hicks contractions are tightening movements that occur in the muscles of the uterus before labor. Unlike true labor contractions, these contractions do not result in opening (dilation) and thinning of the lowest part of the uterus (cervix). Toward the end of pregnancy (32-34 weeks), Braxton Hicks contractions can happen more often and may become stronger. These contractions are sometimes difficult to tell apart from true labor because they can be very uncomfortable. How to tell the difference between true labor and false labor True labor Contractions last 30-70 seconds. Contractions become very regular. Discomfort is usually felt in the top of the uterus, and it spreads to the lower abdomen and low back. Contractions do not go away with walking. Contractions usually become stronger and more frequent. The cervix dilates and gets thinner. False labor Contractions are usually shorter, weaker, and farther apart than true labor contractions. Contractions are usually irregular. Contractions are often felt in the front of the lower abdomen and in the groin. Contractions may go away when you walk around or change positions while lying down. The cervix usually does not dilate or become thin. Sometimes, the only way to tell if you are in true labor is for your health care provider to look for changes in your cervix. Your health care provider will do a physical exam and may monitor your contractions. If you are in true labor, your health care provider will send you home with instructions about when to return to the hospital. You may continue to have Braxton Hicks contractions until you  go into true labor. Follow these instructions at home:  Take over-the-counter and prescription medicines only as told by your health care provider. If Braxton Hicks contractions are making you uncomfortable: Change your position from lying down or resting to walking, or change from walking to resting. Sit and rest in a tub of warm water. Drink enough fluid to keep your urine pale yellow. Dehydration may cause these contractions. Do slow and deep breathing several times an hour. Keep all follow-up visits. This is important. Contact a health care provider if: You have a fever. You have continuous pain in your abdomen. Your contractions become stronger, more regular, and closer together. You pass blood-tinged mucus. Get help right away if: You have fluid leaking or gushing from your vagina. You have bright red blood coming from your vagina. Your baby is not moving inside you as much as it used to. Summary You may have practice contractions called Braxton Hicks contractions. These false labor contractions are sometimes confused with true labor. Braxton Hicks contractions are usually shorter, weaker, farther apart, and less regular than true labor contractions. True labor contractions usually become stronger, more regular, and more frequent. Manage discomfort from Braxton Hicks contractions by changing position, resting in a warm bath, practicing deep breathing, and drinking plenty of water. Keep all follow-up visits. Contact your health care provider if your contractions become stronger, more regular, and closer together. This information is not intended to replace advice given to you by your health care provider. Make sure you discuss any questions you have with your health care provider. Document Revised: 04/29/2020 Document Reviewed: 04/29/2020 Elsevier Patient Education  2022 [employer]  

## 2021-06-12 ENCOUNTER — Other Ambulatory Visit: Payer: Self-pay

## 2021-06-12 ENCOUNTER — Encounter
Admission: RE | Admit: 2021-06-12 | Discharge: 2021-06-12 | Disposition: A | Discharge status: Home / Self Care | Payer: 59 | Source: Ambulatory Visit | Attending: Obstetrics and Gynecology | Admitting: Obstetrics and Gynecology

## 2021-06-12 HISTORY — DX: Family history of other specified conditions: Z84.89

## 2021-06-12 HISTORY — DX: Gastro-esophageal reflux disease without esophagitis: K21.9

## 2021-06-12 HISTORY — DX: Supervision of pregnancy with other poor reproductive or obstetric history, unspecified trimester: O09.299

## 2021-06-12 NOTE — Patient Instructions (Signed)
Your procedure is scheduled on:06-23-21 Monday Report to the Emergency Room and you will then be escorted up to Labor and Delivery-Arrive @ 5:30 AM  REMEMBER: Instructions that are not followed completely may result in serious medical risk, up to and including death; or upon the discretion of your surgeon and anesthesiologist your surgery may need to be rescheduled.  Do not eat food after midnight the night before surgery.  No gum chewing, lozengers or hard candies.  You may however, drink CLEAR liquids up to 2 hours before you are scheduled to arrive for your surgery. Do not drink anything within 2 hours of your scheduled arrival time.  Clear liquids include: - water  - apple juice without pulp - gatorade (not RED, PURPLE, OR BLUE) - black coffee or tea (Do NOT add milk or creamers to the coffee or tea) Do NOT drink anything that is not on this list.  Do not take any medication the day of surgery  aspirin EC 81 MG tablet was stopped last week   One week prior to surgery: Stop Anti-inflammatories (NSAIDS) such as Advil, Aleve, Ibuprofen, Motrin, Naproxen, Naprosyn and Aspirin based products such as Excedrin, Goodys Powder, BC Powder. You may however, take Tylenol if needed for pain up until the day of surgery.  No Alcohol for 24 hours before or after surgery.  No Smoking including e-cigarettes for 24 hours prior to surgery.  No chewable tobacco products for at least 6 hours prior to surgery.  No nicotine patches on the day of surgery.  Do not use any "recreational" drugs for at least a week prior to your surgery.  Please be advised that the combination of cocaine and anesthesia may have negative outcomes, up to and including death. If you test positive for cocaine, your surgery will be cancelled.  On the morning of surgery brush your teeth with toothpaste and water, you may rinse your mouth with mouthwash if you wish. Do not swallow any toothpaste or mouthwash.  Use CHG Soap as  directed on instruction sheet (avoid nipple and private area)  Do not wear jewelry, make-up, hairpins, clips or nail polish.  Do not wear lotions, powders, or perfumes.   Do not shave body from the neck down 48 hours prior to surgery just in case you cut yourself which could leave a site for infection.  Also, freshly shaved skin may become irritated if using the CHG soap.  Contact lenses, hearing aids and dentures may not be worn into surgery.  Do not bring valuables to the hospital. [facility] is not responsible for any missing/lost belongings or valuables.   Notify your doctor if there is any change in your medical condition (cold, fever, infection).  Wear comfortable clothing (specific to your surgery type) to the hospital.  After surgery, you can help prevent lung complications by doing breathing exercises.  Take deep breaths and cough every 1-2 hours. Your doctor may order a device called an Incentive Spirometer to help you take deep breaths. When coughing or sneezing, hold a pillow firmly against your incision with both hands. This is called "splinting." Doing this helps protect your incision. It also decreases belly discomfort.  If you are being admitted to the hospital overnight, leave your suitcase in the car. After surgery it may be brought to your room.  If you are being discharged the day of surgery, you will not be allowed to drive home. You will need a responsible adult (18 years or older) to drive you home  and stay with you that night.   If you are taking public transportation, you will need to have a responsible adult (18 years or older) with you. Please confirm with your physician that it is acceptable to use public transportation.   Please call the Pre-admissions Testing Dept. at [phone] if you have any questions about these instructions.  Surgery Visitation Policy:  Patients undergoing a surgery or procedure may have one family member or support person  with them as long as that person is not COVID-19 positive or experiencing its symptoms.  That person may remain in the waiting area during the procedure and may rotate out with other people.  Inpatient Visitation:    Visiting hours are 7 a.m. to 8 p.m. Up to two visitors ages 16+ are allowed at one time in a patient room. The visitors may rotate out with other people during the day. Visitors must check out when they leave, or other visitors will not be allowed. One designated support person may remain overnight. The visitor must pass COVID-19 screenings, use hand sanitizer when entering and exiting the patient's room and wear a mask at all times, including in the patient's room. Patients must also wear a mask when staff or their visitor are in the room. Masking is required regardless of vaccination status.

## 2021-06-17 ENCOUNTER — Ambulatory Visit (INDEPENDENT_AMBULATORY_CARE_PROVIDER_SITE_OTHER): Payer: 59 | Admitting: Obstetrics and Gynecology

## 2021-06-17 ENCOUNTER — Other Ambulatory Visit: Payer: Self-pay

## 2021-06-17 VITALS — BP 112/78 | HR 91 | Wt 170.4 lb

## 2021-06-17 DIAGNOSIS — Z3483 Encounter for supervision of other normal pregnancy, third trimester: Secondary | ICD-10-CM

## 2021-06-17 DIAGNOSIS — Z3A38 38 weeks gestation of pregnancy: Secondary | ICD-10-CM

## 2021-06-17 LAB — POCT URINALYSIS DIPSTICK OB
Bilirubin, UA: NEGATIVE
Blood, UA: NEGATIVE
Glucose, UA: NEGATIVE
Ketones, UA: NEGATIVE
Leukocytes, UA: NEGATIVE
Nitrite, UA: NEGATIVE
POC,PROTEIN,UA: NEGATIVE
Spec Grav, UA: 1.015 (ref 1.010–1.025)
Urobilinogen, UA: 0.2 E.U./dL
pH, UA: 7.5 (ref 5.0–8.0)

## 2021-06-17 NOTE — Progress Notes (Signed)
ROB: Cesarean delivery discussed in detail.  All questions answered.  Patient reports daily fetal movement.

## 2021-06-19 ENCOUNTER — Other Ambulatory Visit: Payer: 59

## 2021-06-20 ENCOUNTER — Other Ambulatory Visit: Payer: Self-pay

## 2021-06-20 ENCOUNTER — Other Ambulatory Visit
Admission: RE | Admit: 2021-06-20 | Discharge: 2021-06-20 | Disposition: A | Discharge status: Home / Self Care | Payer: 59 | Source: Ambulatory Visit | Attending: Obstetrics and Gynecology | Admitting: Obstetrics and Gynecology

## 2021-06-20 DIAGNOSIS — Z20822 Contact with and (suspected) exposure to covid-19: Secondary | ICD-10-CM

## 2021-06-20 DIAGNOSIS — Z01812 Encounter for preprocedural laboratory examination: Secondary | ICD-10-CM | POA: Insufficient documentation

## 2021-06-20 DIAGNOSIS — Z3A32 32 weeks gestation of pregnancy: Secondary | ICD-10-CM

## 2021-06-20 LAB — CBC
HCT: 36.6 % (ref 36.0–46.0)
Hemoglobin: 12.3 g/dL (ref 12.0–15.0)
MCH: 31.8 pg (ref 26.0–34.0)
MCHC: 33.6 g/dL (ref 30.0–36.0)
MCV: 94.6 fL (ref 80.0–100.0)
Platelets: 153 10*3/uL (ref 150–400)
RBC: 3.87 MIL/uL (ref 3.87–5.11)
RDW: 12.8 % (ref 11.5–15.5)
WBC: 11.5 10*3/uL — ABNORMAL HIGH (ref 4.0–10.5)
nRBC: 0 % (ref 0.0–0.2)

## 2021-06-20 LAB — TYPE AND SCREEN
ABO/RH(D): O POS
Antibody Screen: NEGATIVE
Extend sample reason: UNDETERMINED

## 2021-06-20 LAB — SARS CORONAVIRUS 2 (TAT 6-24 HRS): SARS Coronavirus 2: NEGATIVE

## 2021-06-23 ENCOUNTER — Inpatient Hospital Stay: Admission: RE | Admit: 2021-06-23 | Payer: 59 | Source: Home / Self Care | Admitting: Obstetrics and Gynecology

## 2021-06-23 ENCOUNTER — Inpatient Hospital Stay: Payer: 59 | Admitting: Anesthesiology

## 2021-06-23 ENCOUNTER — Encounter: Payer: Self-pay | Admitting: Obstetrics and Gynecology

## 2021-06-23 ENCOUNTER — Encounter: Payer: Self-pay | Admitting: Certified Nurse Midwife

## 2021-06-23 ENCOUNTER — Encounter
Admission: EM | Disposition: A | Discharge status: Home / Self Care | Payer: Self-pay | Source: Home / Self Care | Attending: Obstetrics and Gynecology

## 2021-06-23 ENCOUNTER — Inpatient Hospital Stay
Admission: EM | Admit: 2021-06-23 | Discharge: 2021-06-24 | DRG: 788 | Disposition: A | Discharge status: Home / Self Care | Payer: 59 | Attending: Obstetrics and Gynecology | Admitting: Obstetrics and Gynecology

## 2021-06-23 ENCOUNTER — Other Ambulatory Visit: Payer: Self-pay

## 2021-06-23 ENCOUNTER — Inpatient Hospital Stay: Payer: 59 | Admitting: Urgent Care

## 2021-06-23 DIAGNOSIS — Z3A39 39 weeks gestation of pregnancy: Secondary | ICD-10-CM | POA: Diagnosis not present

## 2021-06-23 DIAGNOSIS — Z7982 Long term (current) use of aspirin: Secondary | ICD-10-CM

## 2021-06-23 DIAGNOSIS — Z20822 Contact with and (suspected) exposure to covid-19: Secondary | ICD-10-CM | POA: Diagnosis present

## 2021-06-23 DIAGNOSIS — O34211 Maternal care for low transverse scar from previous cesarean delivery: Secondary | ICD-10-CM | POA: Diagnosis present

## 2021-06-23 LAB — ABO/RH: ABO/RH(D): O POS

## 2021-06-23 SURGERY — Surgical Case
Anesthesia: Spinal

## 2021-06-23 MED ORDER — DIPHENHYDRAMINE HCL 25 MG PO CAPS: 25.0000 mg | ORAL_CAPSULE | Freq: Four times a day (QID) | ORAL | Status: DC | PRN

## 2021-06-23 MED ORDER — POVIDONE-IODINE 10 % EX SWAB: 2.0000 "application " | Freq: Once | CUTANEOUS | Status: DC

## 2021-06-23 MED ORDER — SIMETHICONE 80 MG PO CHEW
80.0000 mg | CHEWABLE_TABLET | Freq: Four times a day (QID) | ORAL | Status: DC
  Administered 2021-06-23 – 2021-06-24 (×5): 80 mg via ORAL
  Filled 2021-06-23 (×5): qty 1

## 2021-06-23 MED ORDER — ACETAMINOPHEN 500 MG PO TABS
1000.0000 mg | ORAL_TABLET | Freq: Four times a day (QID) | ORAL | Status: AC
  Administered 2021-06-23 – 2021-06-24 (×4): 1000 mg via ORAL
  Filled 2021-06-23 (×4): qty 2

## 2021-06-23 MED ORDER — CEFAZOLIN SODIUM-DEXTROSE 2-4 GM/100ML-% IV SOLN
2.0000 g | Freq: Once | INTRAVENOUS | Status: AC
  Administered 2021-06-23: 08:00:00 2 g via INTRAVENOUS
  Filled 2021-06-23: qty 100

## 2021-06-23 MED ORDER — SOD CITRATE-CITRIC ACID 500-334 MG/5ML PO SOLN
ORAL | Status: AC
  Filled 2021-06-23: qty 15

## 2021-06-23 MED ORDER — ONDANSETRON HCL 4 MG/2ML IJ SOLN
INTRAMUSCULAR | Status: DC | PRN
  Administered 2021-06-23: 4 mg via INTRAVENOUS

## 2021-06-23 MED ORDER — BUPIVACAINE IN DEXTROSE 0.75-8.25 % IT SOLN
INTRATHECAL | Status: DC | PRN
  Administered 2021-06-23: 1.7 mL via INTRATHECAL

## 2021-06-23 MED ORDER — MORPHINE SULFATE (PF) 0.5 MG/ML IJ SOLN
INTRAMUSCULAR | Status: AC
  Filled 2021-06-23: qty 10

## 2021-06-23 MED ORDER — IBUPROFEN 600 MG PO TABS
600.0000 mg | ORAL_TABLET | Freq: Four times a day (QID) | ORAL | Status: DC
  Administered 2021-06-23 – 2021-06-24 (×6): 600 mg via ORAL
  Filled 2021-06-23 (×6): qty 1

## 2021-06-23 MED ORDER — FENTANYL CITRATE (PF) 100 MCG/2ML IJ SOLN
INTRAMUSCULAR | Status: AC
  Filled 2021-06-23: qty 2

## 2021-06-23 MED ORDER — PHENYLEPHRINE HCL (PRESSORS) 10 MG/ML IV SOLN
INTRAVENOUS | Status: DC | PRN
  Administered 2021-06-23: 80 ug via INTRAVENOUS

## 2021-06-23 MED ORDER — ONDANSETRON HCL 4 MG/2ML IJ SOLN
INTRAMUSCULAR | Status: AC
  Filled 2021-06-23: qty 2

## 2021-06-23 MED ORDER — OXYTOCIN-SODIUM CHLORIDE 30-0.9 UT/500ML-% IV SOLN
INTRAVENOUS | Status: AC
  Administered 2021-06-23: 09:00:00 2.5 [IU]/h via INTRAVENOUS
  Filled 2021-06-23: qty 1000

## 2021-06-23 MED ORDER — MENTHOL 3 MG MT LOZG
1.0000 | LOZENGE | OROMUCOSAL | Status: DC | PRN
  Filled 2021-06-23: qty 9

## 2021-06-23 MED ORDER — LACTATED RINGERS IV SOLN: INTRAVENOUS | Status: DC

## 2021-06-23 MED ORDER — MORPHINE SULFATE (PF) 0.5 MG/ML IJ SOLN
INTRAMUSCULAR | Status: DC | PRN
  Administered 2021-06-23: .1 mg via INTRATHECAL

## 2021-06-23 MED ORDER — OXYTOCIN-SODIUM CHLORIDE 30-0.9 UT/500ML-% IV SOLN
2.5000 [IU]/h | INTRAVENOUS | Status: AC
  Administered 2021-06-23: 18:00:00 2.5 [IU]/h via INTRAVENOUS
  Filled 2021-06-23: qty 500

## 2021-06-23 MED ORDER — PHENYLEPHRINE HCL (PRESSORS) 10 MG/ML IV SOLN
INTRAVENOUS | Status: AC
  Filled 2021-06-23: qty 1

## 2021-06-23 MED ORDER — KETOROLAC TROMETHAMINE 30 MG/ML IJ SOLN
INTRAMUSCULAR | Status: DC | PRN
  Administered 2021-06-23: 30 mg via INTRAVENOUS

## 2021-06-23 MED ORDER — OXYTOCIN-SODIUM CHLORIDE 30-0.9 UT/500ML-% IV SOLN
INTRAVENOUS | Status: DC | PRN
  Administered 2021-06-23: 300 mL via INTRAVENOUS

## 2021-06-23 MED ORDER — EPHEDRINE SULFATE-NACL 50-0.9 MG/10ML-% IV SOSY
PREFILLED_SYRINGE | INTRAVENOUS | Status: DC | PRN
  Administered 2021-06-23: 10 mg via INTRAVENOUS

## 2021-06-23 MED ORDER — ZOLPIDEM TARTRATE 5 MG PO TABS: 5.0000 mg | ORAL_TABLET | Freq: Every evening | ORAL | Status: DC | PRN

## 2021-06-23 MED ORDER — LACTATED RINGERS IV SOLN
Freq: Once | INTRAVENOUS | Status: AC
  Administered 2021-06-23: 06:00:00 500 mL via INTRAVENOUS

## 2021-06-23 MED ORDER — LACTATED RINGERS IV SOLN: Freq: Once | INTRAVENOUS | Status: AC

## 2021-06-23 MED ORDER — PHENYLEPHRINE HCL-NACL 20-0.9 MG/250ML-% IV SOLN
INTRAVENOUS | Status: DC | PRN
Start: 2021-06-23 — End: 2021-06-23
  Administered 2021-06-23: 50 ug/min via INTRAVENOUS

## 2021-06-23 MED ORDER — OXYCODONE-ACETAMINOPHEN 5-325 MG PO TABS: 1.0000 | ORAL_TABLET | ORAL | Status: DC | PRN

## 2021-06-23 MED ORDER — NALBUPHINE HCL 10 MG/ML IJ SOLN
5.0000 mg | Freq: Once | INTRAMUSCULAR | Status: AC
  Administered 2021-06-23: 10:00:00 5 mg via INTRAVENOUS
  Filled 2021-06-23: qty 1

## 2021-06-23 MED ORDER — PRENATAL MULTIVITAMIN CH
1.0000 | ORAL_TABLET | Freq: Every day | ORAL | Status: DC
  Administered 2021-06-23 – 2021-06-24 (×2): 1 via ORAL
  Filled 2021-06-23 (×2): qty 1

## 2021-06-23 MED ORDER — SENNOSIDES-DOCUSATE SODIUM 8.6-50 MG PO TABS
2.0000 | ORAL_TABLET | ORAL | Status: DC
  Administered 2021-06-23 – 2021-06-24 (×2): 2 via ORAL
  Filled 2021-06-23 (×2): qty 2

## 2021-06-23 MED ORDER — LIDOCAINE 5 % EX PTCH
MEDICATED_PATCH | CUTANEOUS | Status: AC
  Filled 2021-06-23: qty 1

## 2021-06-23 MED ORDER — FENTANYL CITRATE (PF) 100 MCG/2ML IJ SOLN
INTRAMUSCULAR | Status: DC | PRN
  Administered 2021-06-23: 15 ug via INTRATHECAL

## 2021-06-23 MED ORDER — KETOROLAC TROMETHAMINE 30 MG/ML IJ SOLN
INTRAMUSCULAR | Status: AC
  Filled 2021-06-23: qty 1

## 2021-06-23 SURGICAL SUPPLY — 30 items
ADHESIVE MASTISOL STRL (MISCELLANEOUS) ×3 IMPLANT
BAG COUNTER SPONGE SURGICOUNT (BAG) ×2 IMPLANT
BAG SURGICOUNT SPONGE COUNTING (BAG) ×1
CELL SAVER LIPIGURD (MISCELLANEOUS) ×1 IMPLANT
CHLORAPREP W/TINT 26 (MISCELLANEOUS) ×6 IMPLANT
DEVICE RETRIEVAL ALEXIS 14 (MISCELLANEOUS) IMPLANT
DRSG TELFA 3X8 NADH (GAUZE/BANDAGES/DRESSINGS) ×3 IMPLANT
EXTRT SYSTEM ALEXIS 14CM (MISCELLANEOUS) ×3
GAUZE SPONGE 4X4 12PLY STRL (GAUZE/BANDAGES/DRESSINGS) ×3 IMPLANT
GLOVE SURG POLY ORTHO LF SZ7.5 (GLOVE) ×3 IMPLANT
GOWN STRL REUS W/ TWL LRG LVL3 (GOWN DISPOSABLE) ×2 IMPLANT
GOWN STRL REUS W/TWL LRG LVL3 (GOWN DISPOSABLE) ×6
KIT TURNOVER KIT A (KITS) ×3 IMPLANT
MANIFOLD NEPTUNE II (INSTRUMENTS) ×3 IMPLANT
MAT PREVALON FULL STRYKER (MISCELLANEOUS) ×3 IMPLANT
NS IRRIG 1000ML POUR BTL (IV SOLUTION) ×3 IMPLANT
PACK C SECTION AR (MISCELLANEOUS) ×3 IMPLANT
PAD DRESSING TELFA 3X8 NADH (GAUZE/BANDAGES/DRESSINGS) ×1 IMPLANT
PAD OB MATERNITY 4.3X12.25 (PERSONAL CARE ITEMS) ×3 IMPLANT
PAD PREP 24X41 OB/GYN DISP (PERSONAL CARE ITEMS) ×3 IMPLANT
PENCIL SMOKE EVACUATOR (MISCELLANEOUS) ×3 IMPLANT
RETRACTOR WND ALEXIS-O 25 LRG (MISCELLANEOUS) ×1 IMPLANT
RTRCTR WOUND ALEXIS O 25CM LRG (MISCELLANEOUS) ×3
SCRUB EXIDINE 4% CHG 4OZ (MISCELLANEOUS) ×3 IMPLANT
SPONGE T-LAP 18X18 ~~LOC~~+RFID (SPONGE) ×3 IMPLANT
SUT VIC AB 0 CTX 36 (SUTURE) ×6
SUT VIC AB 0 CTX36XBRD ANBCTRL (SUTURE) ×2 IMPLANT
SUT VIC AB 1 CT1 36 (SUTURE) ×6 IMPLANT
SUT VICRYL+ 3-0 36IN CT-1 (SUTURE) ×6 IMPLANT
WATER STERILE IRR 500ML POUR (IV SOLUTION) ×3 IMPLANT

## 2021-06-23 NOTE — Op Note (Addendum)
° ° °    OP NOTE  Date: 06/23/2021   8:57 AM Name Krista Hancock MR# [id number]  Preoperative Diagnosis: 1. Intrauterine pregnancy at [redacted]w[redacted]d Principal Problem:   Indication for care in labor or delivery  2.  Pt desires repeat  Postoperative Diagnosis: 1. Intrauterine pregnancy at [redacted]w[redacted]d, delivered 2. Viable infant 3. Remainder same as pre-op   Procedure: 1. Repeat Low-Transverse Cesarean Section  Surgeon: Elonda Husky, MD  Assistant:  Thompson CNM   No other capable assistant was available for this surgery which requires an experienced, high level assistant.  She provided exposure, dissection, suctioning, retraction, and general support and assistance during the procedure.   Anesthesia: Spinal    EBL: 300 ml     Findings: 1) female infant, Apgar scores of 9   at 1 minute and 9   at 5 minutes and a birthweight of 110.76  ounces.    2) Normal uterus, tubes and ovaries.    Procedure:  The patient was prepped and draped in the supine position and placed under spinal anesthesia.  A transverse incision was made across the abdomen in a Pfannenstiel manner. If indicated the old scar was systematically removed with sharp dissection.  We carried the dissection down to the level of the fascia.  The fascia was incised in a curvilinear manner.  The fascia was then elevated from the rectus muscles with blunt and sharp dissection.  The rectus muscles were separated laterally exposing the peritoneum.  The peritoneum was carefully entered with care being taken to avoid bowel and bladder.  A self-retaining retractor was placed.  The visceral peritoneum was incised in a curvilinear fashion across the lower uterine segment creating a bladder flap. A transverse incision was made across the lower uterine segment and extended laterally and superiorly using the bandage scissors.  Artificial rupture membranes was performed and Clear fluid was noted.  The infant was delivered from the cephalic position.   A nuchal cord was present. After an appropriate time interval, the cord was doubly clamped and cut. Cord blood was obtained if required.  The infant was handed to the pediatric personnel  who then placed the infant under heat lamps where it was cleaned dried and suctioned as needed. The placenta was delivered. The hysterotomy incision was then identified on ring forceps.  The uterine cavity was cleaned with a moist lap sponge.  The hysterotomy incision was closed with a running interlocking suture of Vicryl.  Hemostasis was excellent.  Pitocin was run in the IV and the uterus was found to be firm. The posterior cul-de-sac and gutters were cleaned and inspected.  Hemostasis was noted.  The fascia was then closed with a running suture of #1 Vicryl.  Hemostasis of the subcutaneous tissues was obtained using the Bovie.  The subcutaneous tissues were closed with a running suture of 000 Vicryl.  A subcuticular suture was placed.  Steri strips were placed.  A pressure dressing was placed.  The patient went to the recovery room in stable condition.   Elonda Husky, M.D. 06/23/2021 8:57 AM

## 2021-06-23 NOTE — Anesthesia Preprocedure Evaluation (Signed)
Anesthesia Evaluation  Patient identified by MRN, date of birth, ID band Patient awake    Reviewed: Allergy & Precautions, NPO status , Patient's Chart, lab work & pertinent test results  History of Anesthesia Complications Negative for: history of anesthetic complications  Airway Mallampati: I   Neck ROM: Full    Dental no notable dental hx.    Pulmonary neg pulmonary ROS,    Pulmonary exam normal breath sounds clear to auscultation       Cardiovascular Exercise Tolerance: Good negative cardio ROS Normal cardiovascular exam Rhythm:Regular Rate:Normal     Neuro/Psych PSYCHIATRIC DISORDERS Anxiety negative neurological ROS     GI/Hepatic GERD  ,  Endo/Other  negative endocrine ROS  Renal/GU negative Renal ROS     Musculoskeletal   Abdominal   Peds  Hematology negative hematology ROS (+)   Anesthesia Other Findings   Reproductive/Obstetrics                             Anesthesia Physical Anesthesia Plan  ASA: 2  Anesthesia Plan: Spinal   Post-op Pain Management:    Induction: Intravenous  PONV Risk Score and Plan: 2 and Treatment may vary due to age or medical condition and Ondansetron  Airway Management Planned: Natural Airway  Additional Equipment:   Intra-op Plan:   Post-operative Plan:   Informed Consent: I have reviewed the patients History and Physical, chart, labs and discussed the procedure including the risks, benefits and alternatives for the proposed anesthesia with the patient or authorized representative who has indicated his/her understanding and acceptance.       Plan Discussed with: CRNA  Anesthesia Plan Comments: (Plan for spinal, GETA backup.  Patient consented for risks of anesthesia including but not limited to:  - adverse reactions to medications - damage to eyes, teeth, lips or other oral mucosa - nerve damage due to positioning  - sore throat  or hoarseness - headache, bleeding, infection, nerve damage 2/2 spinal - damage to heart, brain, nerves, lungs, other parts of body or loss of life  Informed patient about role of CRNA in peri- and intra-operative care.  Patient voiced understanding.)        Anesthesia Quick Evaluation

## 2021-06-23 NOTE — Anesthesia Postprocedure Evaluation (Signed)
Anesthesia Post Note  Patient: Krista Hancock  Procedure(s) Performed: CESAREAN SECTION ELECTIVE REPEAT  Patient location during evaluation: L&D Anesthesia Type: Spinal Level of consciousness: oriented and awake and alert Pain management: pain level controlled Vital Signs Assessment: post-procedure vital signs reviewed and stable Respiratory status: spontaneous breathing and respiratory function stable Cardiovascular status: blood pressure returned to baseline and stable Postop Assessment: no headache, no backache, no apparent nausea or vomiting and spinal receding Anesthetic complications: no   No notable events documented.   Last Vitals:  Vitals:   06/23/21 0949 06/23/21 0950  BP:    Pulse: 67 74  Resp: 16 16  Temp:    SpO2: 100% 99%    Last Pain:  Vitals:   06/23/21 0912  TempSrc:   PainSc: 0-No pain                 Reed Breech

## 2021-06-23 NOTE — Transfer of Care (Signed)
Immediate Anesthesia Transfer of Care Note  Patient: Krista Hancock  Procedure(s) Performed: CESAREAN SECTION ELECTIVE REPEAT  Patient Location: LDR6  Anesthesia Type:Spinal  Level of Consciousness: awake, alert  and oriented  Airway & Oxygen Therapy: Patient Spontanous Breathing  Post-op Assessment: Report given to RN and Post -op Vital signs reviewed and stable  Post vital signs: Reviewed and stable  Last Vitals:  Vitals Value Taken Time  BP 104/48 06/23/21 0903  Temp 36.7 C 06/23/21 0903  Pulse 75 06/23/21 0903  Resp 20 06/23/21 0903  SpO2      Last Pain:  Vitals:   06/23/21 0903  TempSrc: Oral  PainSc:          Complications: No notable events documented.

## 2021-06-23 NOTE — Anesthesia Procedure Notes (Signed)
Spinal  Patient location during procedure: OR Start time: 06/23/2021 7:43 AM End time: 06/23/2021 7:45 AM Reason for block: surgical anesthesia Staffing Performed: resident/CRNA  Anesthesiologist: Darrin Nipper, MD Resident/CRNA: Aline Brochure, CRNA Preanesthetic Checklist Completed: patient identified, IV checked, site marked, risks and benefits discussed, surgical consent, monitors and equipment checked, pre-op evaluation and timeout performed Spinal Block Patient position: sitting Prep: Betadine Patient monitoring: heart rate, continuous pulse ox, blood pressure and cardiac monitor Approach: midline Location: L4-5 Injection technique: single-shot Needle Needle type: Introducer and Pencan  Needle gauge: 24 G Needle length: 9 cm Assessment Events: CSF return Additional Notes Negative paresthesia. Negative blood return. Positive free-flowing CSF. Expiration date of kit checked and confirmed. Patient tolerated procedure well, without complications.

## 2021-06-23 NOTE — Lactation Note (Signed)
This note was copied from a baby's chart. Lactation Consultation Note  Patient Name: Krista Hancock [id number] Date: 06/23/2021 Reason for consult: Initial assessment;Term Age:30 hours  Initial lactation visit. Mom is P2, repeat c-section at 39 weeks to baby Krista Gracelyn.  Mom exclusively pumped and provided EBM to first baby for 4-5 weeks, cites her milk "dried up". Baby Gracelyn has been latching well since delivery, 2 feedings so far with minimal support. Mom reports no pain or discomfort with either feeding. LC reviewed newborn behaviors and feeding patterns for first 24 hours, early cues and feeding on demand, hand expression, skin to skin, and output expectations. Encouraged 8-12 attempts in first 24 hours, but reassurance given that baby may not feed each time.  Whiteboard updated with LC name/number. Encouraged to call out with questions and for BF support as needed.  Maternal Data Does the patient have breastfeeding experience prior to this delivery?: Yes How long did the patient breastfeed?: 4-5 weeks  Feeding Mother's Current Feeding Choice: Breast Milk  LATCH Score Latch: Grasps breast easily, tongue down, lips flanged, rhythmical sucking.  Audible Swallowing: A few with stimulation  Type of Nipple: Everted at rest and after stimulation  Comfort (Breast/Nipple): Soft / non-tender  Hold (Positioning): No assistance needed to correctly position infant at breast.  LATCH Score: 9   Lactation Tools Discussed/Used    Interventions Interventions: Breast feeding basics reviewed;Hand express;Education  Discharge    Consult Status Consult Status: Follow-up Date: 06/23/21 Follow-up type: Call as needed    Danford Bad 06/23/2021, 12:01 PM

## 2021-06-23 NOTE — H&P (Signed)
History and Physical   HPI  Elliana Mulhall is a 30 y.o. [id number] at [redacted]w[redacted]d Estimated Date of Delivery: 06/30/21 who is being admitted for C-section repeat.   OB History  OB History  Gravida Para Term Preterm AB Living  2 2 2  0 0 2  SAB IAB Ectopic Multiple Live Births  0 0 0 0 2    # Outcome Date GA Lbr Len/2nd Weight Sex Delivery Anes PTL Lv  2 Term 06/23/21 [redacted]w[redacted]d  3140 g F CS-LTranv Spinal  LIV     Name: Beggs,GIRL Brecklynn     Apgar1: 9  Apgar5: 9  1 Term 06/02/19   2551 g F CS-Unspec   LIV    PROBLEM LIST  Pregnancy complications or risks: Patient Active Problem List   Diagnosis Date Noted   Indication for care in labor or delivery 06/23/2021    Prenatal labs and studies: ABO, Rh: --/--/O POS Performed at [facility], [address] [place], [place]  [phone]) Antibody: NEG (12/16 1044) Rubella: 7.38 (06/09 1559) RPR: Non Reactive (10/05 0925)  HBsAg: Negative (06/09 1559)  HIV: Non Reactive (06/09 1559)  12-28-1998-- (11/29 1518)   Past Medical History:  Diagnosis Date   Anxiety    Family history of adverse reaction to anesthesia    mom-nausea   GERD (gastroesophageal reflux disease)    related to pregnancy-no meds   High cholesterol    IUGR (intrauterine growth restriction) in prior pregnancy, pregnant      Past Surgical History:  Procedure Laterality Date   APPENDECTOMY     CESAREAN SECTION     FRACTURE SURGERY Right    hand     Medications    Current Discharge Medication List     CONTINUE these medications which have NOT CHANGED   Details  Prenatal Vit-Fe Fumarate-FA (PRENATAL MULTIVITAMIN) TABS tablet Take 1 tablet by mouth in the morning.    aspirin EC 81 MG tablet Take 1 tablet (81 mg total) by mouth daily. Swallow whole. Start at [redacted] weeks pregnant Qty: 30 tablet, Refills: 11    Doxylamine-Pyridoxine 10-10 MG TBEC Take 1 tablet by mouth 4 (four) times daily. Day 1 &2: 2 tablet at bedtimeDay 3 : if  symptoms persists 1 tablet am; 2 tablet at bedtimeDay 4: 1 tablet am, 1 tab afternoon, 2 tab at bedtime Qty: 120 tablet, Refills: 5    ondansetron (ZOFRAN-ODT) 4 MG disintegrating tablet Take 1 tablet (4 mg total) by mouth every 8 (eight) hours as needed for nausea or vomiting. Qty: 20 tablet, Refills: 0         Allergies  Patient has no known allergies.  Review of Systems  Pertinent items noted in HPI and remainder of comprehensive ROS otherwise negative.  Physical Exam  BP 115/67 (BP Location: Left Arm)    Pulse 92    Temp 98.7 F (37.1 C) (Oral)    Resp 18    Ht 5\' 8"  (1.727 m)    Wt 78.5 kg    LMP 09/23/2020 (Exact Date)    Breastfeeding Unknown    BMI 26.30 kg/m   Lungs:  CTA B Cardio: RRR without M/R/G Abd: Soft, gravid, NT Presentation: cephalic EXT: No C/C/ 1+ Edema DTRs: 2+ B CERVIX:    See Prenatal records for more detailed PE.     Test Results  Results for orders placed or performed during the hospital encounter of 06/23/21 (from the past 24 hour(s))  ABO/Rh  Status: None   Collection Time: 06/23/21  6:04 AM  Result Value Ref Range   ABO/RH(D)      O POS Performed at [facility], [address] [place], [place] [address]    Group B Strep negative  Assessment   [id number] at [redacted]w[redacted]d Estimated Date of Delivery: 06/30/21  The fetus is reassuring.   Patient Active Problem List   Diagnosis Date Noted   Indication for care in labor or delivery 06/23/2021    Plan  1. Admit to L&D :   2. EFM: -- Category 1 3. Repeat CD  Elonda Husky, M.D. 06/23/2021 8:54 AM

## 2021-06-24 MED ORDER — IBUPROFEN 600 MG PO TABS: 600.0000 mg | ORAL_TABLET | Freq: Four times a day (QID) | ORAL | 0 refills | Status: DC

## 2021-06-24 MED ORDER — OXYCODONE-ACETAMINOPHEN 5-325 MG PO TABS: 1.0000 | ORAL_TABLET | ORAL | 0 refills | Status: DC | PRN

## 2021-06-24 NOTE — Discharge Summary (Signed)
Physician Obstetric Discharge Summary  Patient Name: Krista Hancock DOB: 09/08/1990 MRN: [id number]                            Discharge Summary  Date of Admission: 06/23/2021 Date of Discharge: 06/24/2021 Delivering Provider: Harlin Heys   Admitting Diagnosis: Indication for care in labor or delivery [O75.9] at [redacted]w[redacted]d Secondary diagnosis:  Principal Problem:   Indication for care in labor or delivery   Mode of Delivery:       low uterine, transverse     Discharge diagnosis: Term Pregnancy Delivered      Intrapartum Procedures:  spinal   Post partum procedures:  none  Complications: none                     Discharge Day SOAP Note:  Subjective:  The patient has no complaints.  She is ambulating well. She is taking PO well. Pain is well controlled with current medications. Patient is urinating without difficulty.   She is passing flatus.    Objective  Vital signs: BP 122/78 (BP Location: Left Arm)    Pulse 65    Temp 97.6 F (36.4 C) (Oral)    Resp 18    Ht 5\' 8"  (1.727 m)    Wt 78.5 kg    LMP 09/23/2020 (Exact Date)    SpO2 100%    Breastfeeding Unknown    BMI 26.30 kg/m   Physical Exam: Gen: NAD Abdomen:  clean, dry, no drainage Fundus Fundal Tone: Firm  Lochia Amount: Small     Data Review Labs: Lab Results  Component Value Date   WBC 11.5 (H) 06/20/2021   HGB 12.3 06/20/2021   HCT 36.6 06/20/2021   MCV 94.6 06/20/2021   PLT 153 06/20/2021   CBC Latest Ref Rng & Units 06/20/2021 04/09/2021 12/17/2020  WBC 4.0 - 10.5 K/uL 11.5(H) 9.2 11.5(H)  Hemoglobin 12.0 - 15.0 g/dL 12.3 12.2 11.9  Hematocrit 36.0 - 46.0 % 36.6 36.2 35.9  Platelets 150 - 400 K/uL 153 180 191   O POS Performed at [facility], [address] [place], [place] [address]   Flavia Shipper Score: Flavia Shipper Postnatal Depression Scale Screening Tool 06/23/2021  I have been able to laugh and see the funny side of things. 0  I have looked forward with enjoyment to  things. 0  I have blamed myself unnecessarily when things went wrong. 0  I have been anxious or worried for no good reason. 0  I have felt scared or panicky for no good reason. 0  Things have been getting on top of me. 0  I have been so unhappy that I have had difficulty sleeping. 0  I have felt sad or miserable. 0  I have been so unhappy that I have been crying. 0  The thought of harming myself has occurred to me. 0  Edinburgh Postnatal Depression Scale Total 0    Assessment:  Principal Problem:   Indication for care in labor or delivery   Doing well.  Normal progress as expected.  Plan:  Discharge to home  Modified rest as directed - may slowly resume normal activities with restrictions  as discussed.  Medications as written.  See below for additional.      Discharge Instructions: Per After Visit Summary. Activity: Advance as tolerated. Pelvic rest for 6 weeks.  Also refer to After Visit Summary.  Wound care discussed. Diet:  Regular Medications: Allergies as of 06/24/2021   No Known Allergies      Medication List     STOP taking these medications    aspirin EC 81 MG tablet   Doxylamine-Pyridoxine 10-10 MG Tbec   ondansetron 4 MG disintegrating tablet Commonly known as: ZOFRAN-ODT       TAKE these medications    ibuprofen 600 MG tablet Commonly known as: ADVIL Take 1 tablet (600 mg total) by mouth every 6 (six) hours.   oxyCODONE-acetaminophen 5-325 MG tablet Commonly known as: PERCOCET/ROXICET Take 1-2 tablets by mouth every 4 (four) hours as needed for moderate pain.   prenatal multivitamin Tabs tablet Take 1 tablet by mouth in the morning.       Outpatient follow up: 1 wk office post op, 6 wk PPV with Pattricia Boss Postpartum contraception: Will discuss at first post-partum visit.  Discharged Condition: good  Discharged to: home  Newborn Data: Disposition:home with mother  Apgars: APGAR (1 MIN): 9   APGAR (5 MINS): 9   APGAR (10 MINS):     Baby Feeding: Breast  Doreene Burke, CNM  06/24/2021 6:38 PM

## 2021-06-24 NOTE — Final Progress Note (Signed)
Discharge Day SOAP Note:  Subjective:  The patient has no complaints.  She is ambulating well. She is taking PO well. Pain is well controlled with current medications. Patient is urinating without difficulty.   She is passing flatus.    Objective  Vital signs: BP 122/78 (BP Location: Left Arm)    Pulse 65    Temp 97.6 F (36.4 C) (Oral)    Resp 18    Ht 5\' 8"  (1.727 m)    Wt 78.5 kg    LMP 09/23/2020 (Exact Date)    SpO2 100%    Breastfeeding Unknown    BMI 26.30 kg/m   Physical Exam: Gen: NAD Abdomen:  clean, dry, no drainage Fundus Fundal Tone: Firm  Lochia Amount: Small     Data Review Labs: Lab Results  Component Value Date   WBC 11.5 (H) 06/20/2021   HGB 12.3 06/20/2021   HCT 36.6 06/20/2021   MCV 94.6 06/20/2021   PLT 153 06/20/2021   CBC Latest Ref Rng & Units 06/20/2021 04/09/2021 12/17/2020  WBC 4.0 - 10.5 K/uL 11.5(H) 9.2 11.5(H)  Hemoglobin 12.0 - 15.0 g/dL 12/19/2020 23.5 36.1  Hematocrit 36.0 - 46.0 % 36.6 36.2 35.9  Platelets 150 - 400 K/uL 153 180 191   O POS Performed at [facility], [address] [place], [place]   [address] Score: Inocente Salles Postnatal Depression Scale Screening Tool 06/23/2021  I have been able to laugh and see the funny side of things. 0  I have looked forward with enjoyment to things. 0  I have blamed myself unnecessarily when things went wrong. 0  I have been anxious or worried for no good reason. 0  I have felt scared or panicky for no good reason. 0  Things have been getting on top of me. 0  I have been so unhappy that I have had difficulty sleeping. 0  I have felt sad or miserable. 0  I have been so unhappy that I have been crying. 0  The thought of harming myself has occurred to me. 0  Edinburgh Postnatal Depression Scale Total 0    Assessment:  Principal Problem:   Indication for care in labor or delivery   Doing well.  Normal progress as expected.  Plan:  Discharge to home  Modified rest as  directed - may slowly resume normal activities with restrictions  as discussed.  Medications as written.  See below for additional.      Discharge Instructions: Per After Visit Summary. Activity: Advance as tolerated. Pelvic rest for 6 weeks.  Also refer to After Visit Summary.  Wound care discussed. Diet: Regular Medications: Allergies as of 06/24/2021   No Known Allergies      Medication List     STOP taking these medications    aspirin EC 81 MG tablet   Doxylamine-Pyridoxine 10-10 MG Tbec   ondansetron 4 MG disintegrating tablet Commonly known as: ZOFRAN-ODT       TAKE these medications    ibuprofen 600 MG tablet Commonly known as: ADVIL Take 1 tablet (600 mg total) by mouth every 6 (six) hours.   oxyCODONE-acetaminophen 5-325 MG tablet Commonly known as: PERCOCET/ROXICET Take 1-2 tablets by mouth every 4 (four) hours as needed for moderate pain.   prenatal multivitamin Tabs tablet Take 1 tablet by mouth in the morning.       Outpatient follow up: 1 wk office post op, 6 wk PPV with 06/26/2021 Postpartum contraception: Will discuss at first post-partum visit.  Discharged Condition: good  Discharged to: home  Newborn Data: Disposition:home with mother  Apgars: APGAR (1 MIN): 9   APGAR (5 MINS): 9   APGAR (10 MINS):    Baby Feeding: Breast  Philip Aspen, CNM  06/24/2021 6:38 PM

## 2021-06-24 NOTE — Lactation Note (Signed)
This note was copied from a baby's chart. Lactation Consultation Note  Patient Name: Krista Hancock [id number] Date: 06/24/2021 Reason for consult: Follow-up assessment Age:31 hours  Follow-up visit. Baby is 26 HOL. Baby has been cluster feeding throughout the night and early morning with poops/pees exceed expectations for HOL.  Upon entry mom and baby were skin to skin, baby sleeping soundly. Mom reports that feedings are going well, confident in baby's latch- no pain or discomfort.  [employer] student reviewed ongoing output expectations for day 2. Encouraged continued feedings on demand and calling out with any questions/concerns.  Maternal Data Has patient been taught Hand Expression?: Yes Does the patient have breastfeeding experience prior to this delivery?: Yes How long did the patient breastfeed?: 4-5 weeks  Feeding Mother's Current Feeding Choice: Breast Milk  LATCH Score                    Lactation Tools Discussed/Used    Interventions Interventions: Breast feeding basics reviewed;Education  Discharge    Consult Status Consult Status: PRN    Danford Bad 06/24/2021, 11:18 AM

## 2021-06-24 NOTE — Progress Notes (Signed)
Progress Note - Cesarean Delivery  Krista Hancock is a 30 y.o. G2P2002 now PP day 1 s/p C-Section, Low Transverse .   Subjective:  Patient reports no problems with eating, bowel movements, voiding, or their wound  She reports that the baby did some cluster feeding last night and she did not get much rest.  She is not having significant pain from her incision.  Objective:  Vital signs in last 24 hours: Temp:  [98.1 F (36.7 C)-98.6 F (37 C)] 98.6 F (37 C) (12/19 2347) Pulse Rate:  [62-84] 70 (12/19 2347) Resp:  [8-25] 18 (12/19 2347) BP: (97-115)/(48-71) 104/70 (12/19 2347) SpO2:  [95 %-100 %] 96 % (12/20 0300)  Physical Exam:  General: alert, cooperative, and no distress Lochia: appropriate Uterine Fundus: firm Incision: Dressing intact.    Data Review No results for input(s): HGB, HCT in the last 72 hours.  Assessment:  Principal Problem:   Indication for care in labor or delivery   Status post Cesarean section. Doing well postoperatively.     Plan:       Continue current care.  Out of bed today.  Patient may shower.  Dressing change as needed.   Plan for discharge tomorrow.  Elonda Husky, M.D. 06/24/2021 7:38 AM

## 2021-06-24 NOTE — Progress Notes (Signed)
All discharge instructions reviewed with pt; pt knows when to make follow up appointment and when to call MD; pt discharged via wheelchair escorted by [facility] to medical mall; pt going home with husband and new baby

## 2021-07-01 ENCOUNTER — Telehealth (INDEPENDENT_AMBULATORY_CARE_PROVIDER_SITE_OTHER): Payer: 59 | Admitting: Certified Nurse Midwife

## 2021-07-01 ENCOUNTER — Encounter: Payer: Self-pay | Admitting: Certified Nurse Midwife

## 2021-07-01 DIAGNOSIS — Z5189 Encounter for other specified aftercare: Secondary | ICD-10-CM

## 2021-07-02 NOTE — Progress Notes (Signed)
Pt not seen ,

## 2021-07-09 ENCOUNTER — Ambulatory Visit (INDEPENDENT_AMBULATORY_CARE_PROVIDER_SITE_OTHER): Payer: 59 | Admitting: Certified Nurse Midwife

## 2021-07-09 ENCOUNTER — Other Ambulatory Visit: Payer: Self-pay

## 2021-07-09 VITALS — BP 115/61 | HR 80 | Wt 159.8 lb

## 2021-07-09 DIAGNOSIS — Z5189 Encounter for other specified aftercare: Secondary | ICD-10-CM | POA: Diagnosis not present

## 2021-07-09 NOTE — Progress Notes (Signed)
° ° °  OBSTETRICS/GYNECOLOGY POST-OPERATIVE CLINIC VISIT  Subjective:     Krista Hancock is a 31 y.o. female who presents to the clinic 3 weeks status post  repeat cesarean section  . Eating a regular diet without difficulty. Bowel movements are normal. The patient is not having any pain.  The following portions of the patient's history were reviewed and updated as appropriate: allergies, current medications, past family history, past medical history, past social history, past surgical history, and problem list.  Review of Systems Pertinent items are noted in HPI.   Objective:   LMP 09/23/2020 (Exact Date)  There is no height or weight on file to calculate BMI.  General:  alert and no distress  Abdomen: soft, bowel sounds active, non-tender  Incision:   healing well, no drainage, no erythema, no hernia, no seroma, no swelling, no dehiscence, incision well approximated    Pathology:    Assessment:   Patient s/p repeat c-section (surgery)  Doing well postoperatively.   Plan:   1. Continue any current medications as instructed by provider. 2. Wound care discussed. 3. Operative findings again reviewed. Pathology report discussed. 4. Activity restrictions: no lifting more than 10 pounds and no standing longer than 1 without a break 5. Anticipated return to work:  12 wks postpartum . 6. Follow up: 3 weeks for 6 wk PPV    Philip Aspen, CNM [facility]

## 2021-07-29 ENCOUNTER — Encounter: Payer: Self-pay | Admitting: Certified Nurse Midwife

## 2021-08-04 ENCOUNTER — Encounter: Payer: Self-pay | Admitting: Certified Nurse Midwife

## 2021-08-04 ENCOUNTER — Ambulatory Visit (INDEPENDENT_AMBULATORY_CARE_PROVIDER_SITE_OTHER): Payer: 59 | Admitting: Certified Nurse Midwife

## 2021-08-04 ENCOUNTER — Other Ambulatory Visit: Payer: Self-pay

## 2021-08-04 NOTE — Progress Notes (Signed)
Subjective:    Krista Hancock is a 31 y.o. G21P2002 Caucasian female who presents for a postpartum visit. She is 6 weeks postpartum following a repeat cesarean section, low transverse incision at 39 gestational weeks. Anesthesia: spinal. I have fully reviewed the prenatal and intrapartum course. Postpartum course has been normal. Baby's course has been normal. Baby is feeding by  bottle . Bleeding  starting period  . Bowel function is normal. Bladder function is normal. Patient is not sexually active.  Contraception method is  natural family planning . Postpartum depression screening: negative. Score 0.  Last pap 12/17/2020 and was negative.  The following portions of the patient's history were reviewed and updated as appropriate: allergies, current medications, past medical history, past surgical history and problem list.  Review of Systems Pertinent items are noted in HPI.   Vitals:   08/04/21 1040  BP: 109/72  Pulse: 92  Weight: 153 lb 12.8 oz (69.8 kg)  Height: 5\' 8"  (1.727 m)   Patient's last menstrual period was 08/04/2021 (exact date).  Objective:   General:  alert, cooperative and no distress   Breasts:  deferred, no complaints  Lungs: clear to auscultation bilaterally  Heart:  regular rate and rhythm  Abdomen: soft, nontender   Vulva: normal  Vagina: normal vagina  Cervix:  closed  Corpus: Well-involuted  Adnexa:  Non-palpable  Rectal Exam: no hemorrhoids        Assessment:   Postpartum exam 6 wks s/p repeat c/section Bottle feeding Depression screening Contraception counseling   Plan:  :  natural family planning Follow up in: 6 months for annual or earlier if needed  08/06/2021, CNM

## 2021-08-04 NOTE — Patient Instructions (Signed)
Preventive Care 21-31 Years Old, Female °Preventive care refers to lifestyle choices and visits with your health care provider that can promote health and wellness. Preventive care visits are also called wellness exams. °What can I expect for my preventive care visit? °Counseling °During your preventive care visit, your health care provider may ask about your: °Medical history, including: °Past medical problems. °Family medical history. °Pregnancy history. °Current health, including: °Menstrual cycle. °Method of birth control. °Emotional well-being. °Home life and relationship well-being. °Sexual activity and sexual health. °Lifestyle, including: °Alcohol, nicotine or tobacco, and drug use. °Access to firearms. °Diet, exercise, and sleep habits. °Work and work environment. °Sunscreen use. °Safety issues such as seatbelt and bike helmet use. °Physical exam °Your health care provider may check your: °Height and weight. These may be used to calculate your BMI (body mass index). BMI is a measurement that tells if you are at a healthy weight. °Waist circumference. This measures the distance around your waistline. This measurement also tells if you are at a healthy weight and may help predict your risk of certain diseases, such as type 2 diabetes and high blood pressure. °Heart rate and blood pressure. °Body temperature. °Skin for abnormal spots. °What immunizations do I need? °Vaccines are usually given at various ages, according to a schedule. Your health care provider will recommend vaccines for you based on your age, medical history, and lifestyle or other factors, such as travel or where you work. °What tests do I need? °Screening °Your health care provider may recommend screening tests for certain conditions. This may include: °Pelvic exam and Pap test. °Lipid and cholesterol levels. °Diabetes screening. This is done by checking your blood sugar (glucose) after you have not eaten for a while (fasting). °Hepatitis B  test. °Hepatitis C test. °HIV (human immunodeficiency virus) test. °STI (sexually transmitted infection) testing, if you are at risk. °BRCA-related cancer screening. This may be done if you have a family history of breast, ovarian, tubal, or peritoneal cancers. °Talk with your health care provider about your test results, treatment options, and if necessary, the need for more tests. °Follow these instructions at home: °Eating and drinking ° °Eat a healthy diet that includes fresh fruits and vegetables, whole grains, lean protein, and low-fat dairy products. °Take vitamin and mineral supplements as recommended by your health care provider. °Do not drink alcohol if: °Your health care provider tells you not to drink. °You are pregnant, may be pregnant, or are planning to become pregnant. °If you drink alcohol: °Limit how much you have to 0-1 drink a day. °Know how much alcohol is in your drink. In the [place] one drink equals one 12 oz bottle of beer (355 mL), one 5 oz glass of wine (148 mL), or one 1½ oz glass of hard liquor (44 mL). °Lifestyle °Brush your teeth every morning and night with fluoride toothpaste. Floss one time each day. °Exercise for at least 30 minutes 5 or more days each week. °Do not use any products that contain nicotine or tobacco. These products include cigarettes, chewing tobacco, and vaping devices, such as e-cigarettes. If you need help quitting, ask your health care provider. °Do not use drugs. °If you are sexually active, practice safe sex. Use a condom or other form of protection to prevent STIs. °If you do not wish to become pregnant, use a form of birth control. If you plan to become pregnant, see your health care provider for a prepregnancy visit. °Find healthy ways to manage stress, such as: °Meditation, yoga,   or listening to music. °Journaling. °Talking to a trusted person. °Spending time with friends and family. °Minimize exposure to UV radiation to reduce your risk of skin  cancer. °Safety °Always wear your seat belt while driving or riding in a vehicle. °Do not drive: °If you have been drinking alcohol. Do not ride with someone who has been drinking. °If you have been using any mind-altering substances or drugs. °While texting. °When you are tired or distracted. °Wear a helmet and other protective equipment during sports activities. °If you have firearms in your house, make sure you follow all gun safety procedures. °Seek help if you have been physically or sexually abused. °What's next? °Go to your health care provider once a year for an annual wellness visit. °Ask your health care provider how often you should have your eyes and teeth checked. °Stay up to date on all vaccines. °This information is not intended to replace advice given to you by your health care provider. Make sure you discuss any questions you have with your health care provider. °Document Revised: 12/18/2020 Document Reviewed: 12/18/2020 °Elsevier Patient Education © 2022 [employer] Inc. ° °

## 2021-08-09 ENCOUNTER — Encounter: Payer: Self-pay | Admitting: Certified Nurse Midwife

## 2021-08-20 ENCOUNTER — Encounter: Payer: Self-pay | Admitting: Certified Nurse Midwife

## 2021-08-22 ENCOUNTER — Other Ambulatory Visit: Payer: Self-pay | Admitting: Certified Nurse Midwife

## 2021-08-26 ENCOUNTER — Other Ambulatory Visit: Payer: Self-pay | Admitting: Certified Nurse Midwife

## 2021-08-27 ENCOUNTER — Telehealth: Payer: Self-pay | Admitting: Certified Nurse Midwife

## 2021-08-27 ENCOUNTER — Other Ambulatory Visit: Payer: Self-pay

## 2021-08-27 MED ORDER — MEASLES, MUMPS & RUBELLA VAC IJ SOLR: 0.5000 mL | Freq: Once | INTRAMUSCULAR | 0 refills | Status: AC

## 2021-08-27 MED ORDER — VARICELLA-ZOSTER IMMUNE GLOB 125 UNIT/1.2ML IM SOLN: 1.0000 | Freq: Once | INTRAMUSCULAR | 0 refills | Status: AC

## 2021-08-27 NOTE — Telephone Encounter (Signed)
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 Pharmacy stated that they have the vaccines on hand but need a RX to administer them to patient. Please advise.

## 2021-08-27 NOTE — Telephone Encounter (Signed)
New order sent; receipt confirmed by pharmacy. Patient made aware.

## 2021-08-27 NOTE — Telephone Encounter (Signed)
Message made aware to provider, waiting for response.

## 2022-02-17 ENCOUNTER — Encounter: Payer: Self-pay | Admitting: Certified Nurse Midwife

## 2022-02-17 ENCOUNTER — Ambulatory Visit (INDEPENDENT_AMBULATORY_CARE_PROVIDER_SITE_OTHER): Payer: BC Managed Care – PPO | Admitting: Certified Nurse Midwife

## 2022-02-17 VITALS — BP 122/76 | HR 93 | Ht 68.0 in | Wt 150.6 lb

## 2022-02-17 DIAGNOSIS — Z01419 Encounter for gynecological examination (general) (routine) without abnormal findings: Secondary | ICD-10-CM | POA: Diagnosis not present

## 2022-02-17 DIAGNOSIS — Z13220 Encounter for screening for lipoid disorders: Secondary | ICD-10-CM

## 2022-02-17 DIAGNOSIS — Z136 Encounter for screening for cardiovascular disorders: Secondary | ICD-10-CM

## 2022-02-17 NOTE — Progress Notes (Signed)
GYNECOLOGY ANNUAL PREVENTATIVE CARE ENCOUNTER NOTE  History:     Krista Hancock is a 31 y.o. G22P2002 female here for a routine annual gynecologic exam.  Current complaints: none.   Denies abnormal vaginal bleeding, discharge, pelvic pain, problems with intercourse or other gynecologic concerns.     Social Relationship: Married  Living: spouse and children  Work:  Chief of Staff school for [employer] Exercise: active , taking care of girls Smoke/Alcohol/drug use: occ alcohol use , denies smoking/drug use  Gynecologic History No LMP recorded (lmp unknown). Contraception:  natural family planning Last Pap: 12/17/2020. Results were: normal with negative HPV Last mammogram: n/a .    Upstream - 02/17/22 1320       Pregnancy Intention Screening   Does the patient want to become pregnant in the next year? No    Does the patient's partner want to become pregnant in the next year? No    Would the patient like to discuss contraceptive options today? No            The pregnancy intention screening data noted above was reviewed. Potential methods of contraception were discussed. The patient elected to proceed with NFP.   Obstetric History OB History  Gravida Para Term Preterm AB Living  2 2 2     2   SAB IAB Ectopic Multiple Live Births        0 2    # Outcome Date GA Lbr Len/2nd Weight Sex Delivery Anes PTL Lv  2 Term 06/23/21 [redacted]w[redacted]d  6 lb 14.8 oz (3.14 kg) F CS-LTranv Spinal  LIV  1 Term 06/02/19   5 lb 10 oz (2.551 kg) F CS-Unspec   LIV    Past Medical History:  Diagnosis Date   Anxiety    Family history of adverse reaction to anesthesia    mom-nausea   GERD (gastroesophageal reflux disease)    related to pregnancy-no meds   High cholesterol    IUGR (intrauterine growth restriction) in prior pregnancy, pregnant     Past Surgical History:  Procedure Laterality Date   APPENDECTOMY     CESAREAN SECTION     CESAREAN SECTION N/A 06/23/2021   Procedure: CESAREAN SECTION  ELECTIVE REPEAT;  Surgeon: 06/25/2021, MD;  Location: [facility] ORS;  Service: Obstetrics;  Laterality: N/A;   FRACTURE SURGERY Right    hand    Current Outpatient Medications on File Prior to Visit  Medication Sig Dispense Refill   ibuprofen (ADVIL) 600 MG tablet Take 1 tablet (600 mg total) by mouth every 6 (six) hours. (Patient not taking: Reported on 02/17/2022) 30 tablet 0   No current facility-administered medications on file prior to visit.    No Known Allergies  Social History:  reports that she has never smoked. She has never used smokeless tobacco. She reports that she does not drink alcohol and does not use drugs.  Family History  Problem Relation Age of Onset   Hypertension Mother    Colon polyps Mother    Hypertension Father    Healthy Brother    Colon polyps Maternal Grandmother    Hypertension Paternal Grandmother    Stroke Paternal Grandmother    Bladder Cancer Paternal Grandfather     The following portions of the patient's history were reviewed and updated as appropriate: allergies, current medications, past family history, past medical history, past social history, past surgical history and problem list.  Review of Systems Pertinent items noted in HPI and remainder of comprehensive ROS otherwise  negative.  Physical Exam:  BP 122/76   Pulse 93   Ht 5\' 8"  (1.727 m)   Wt 150 lb 9.6 oz (68.3 kg)   LMP  (LMP Unknown)   Breastfeeding No   BMI 22.90 kg/m  CONSTITUTIONAL: Well-developed, well-nourished female in no acute distress.  HENT:  Normocephalic, atraumatic, External right and left ear normal. Oropharynx is clear and moist EYES: Conjunctivae and EOM are normal. Pupils are equal, round, and reactive to light. No scleral icterus.  NECK: Normal range of motion, supple, no masses.  Normal thyroid.  SKIN: Skin is warm and dry. No rash noted. Not diaphoretic. No erythema. No pallor. MUSCULOSKELETAL: Normal range of motion. No tenderness.  No cyanosis,  clubbing, or edema.  2+ distal pulses. NEUROLOGIC: Alert and oriented to person, place, and time. Normal reflexes, muscle tone coordination.  PSYCHIATRIC: Normal mood and affect. Normal behavior. Normal judgment and thought content. CARDIOVASCULAR: Normal heart rate noted, regular rhythm RESPIRATORY: Clear to auscultation bilaterally. Effort and breath sounds normal, no problems with respiration noted. BREASTS: Symmetric in size. No masses, tenderness, skin changes, nipple drainage, or lymphadenopathy bilaterally.  ABDOMEN: Soft, no distention noted.  No tenderness, rebound or guarding.  PELVIC: Normal appearing external genitalia and urethral meatus; normal appearing vaginal mucosa and cervix.  No abnormal discharge noted.  Pap smear obtained.  Normal uterine size, no other palpable masses, no uterine or adnexal tenderness.  .   Assessment and Plan:    1. Well woman exam with routine gynecological exam  Pap: not indicated Mammogram : n/a  Labs:lipid  Refills: none Referral: none Routine preventative health maintenance measures emphasized. Please refer to After Visit Summary for other counseling recommendations.      , CNM [facility],  [facility]

## 2022-02-18 ENCOUNTER — Encounter: Payer: Self-pay | Admitting: Certified Nurse Midwife

## 2022-02-18 LAB — LIPID PANEL
Chol/HDL Ratio: 3.5 ratio (ref 0.0–4.4)
Cholesterol, Total: 212 mg/dL — ABNORMAL HIGH (ref 100–199)
HDL: 60 mg/dL (ref >39)
LDL Chol Calc (NIH): 134 mg/dL — ABNORMAL HIGH (ref 0–99)
Triglycerides: 101 mg/dL (ref 0–149)
VLDL Cholesterol Cal: 18 mg/dL (ref 5–40)

## 2022-09-27 IMAGING — US US OB COMP LESS 14 WK
1 series · 15 of 28 positions shown · non-contrast
Comparison: None

CLINICAL DATA: Pregnancy, assessment of dating and viability, LMP
09/23/2020

EXAM:
OBSTETRIC <14 WK ULTRASOUND
TECHNIQUE: Transabdominal ultrasound was performed for evaluation of the
gestation as well as the maternal uterus and adnexal regions.

[Series 1: us ob comp less 14 wk · 15 of 41 slices shown]
[im 1/41]
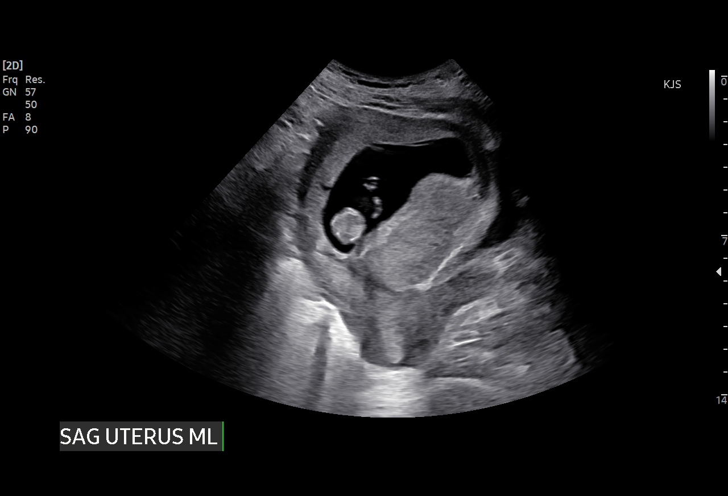
[im 3/41]
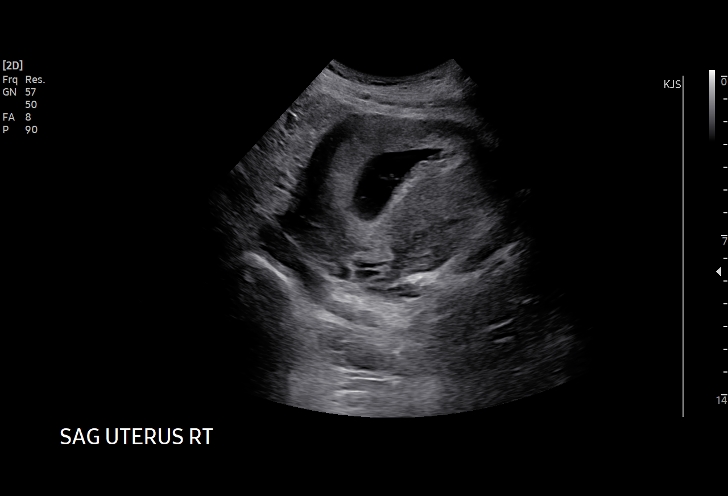
[im 6/41]
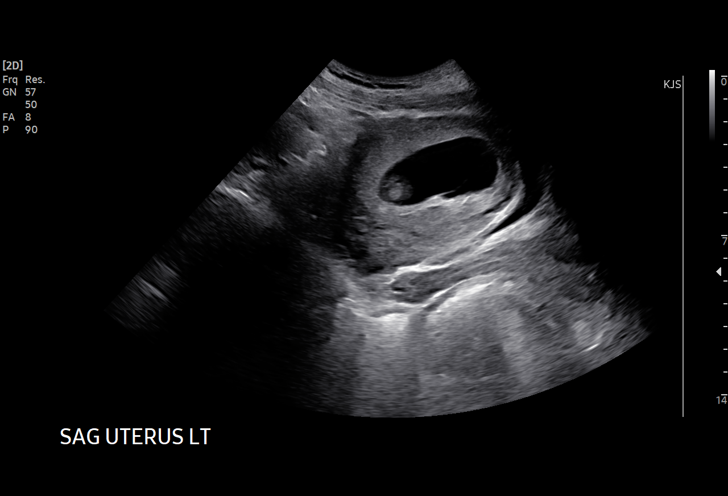
[im 9/41]
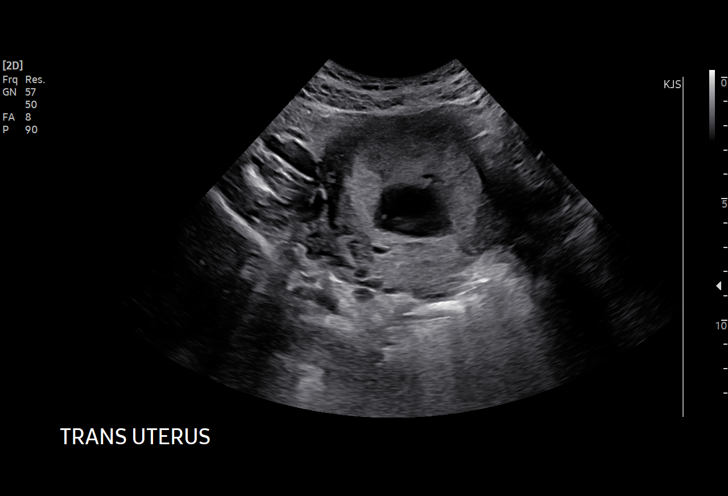
[im 12/41]
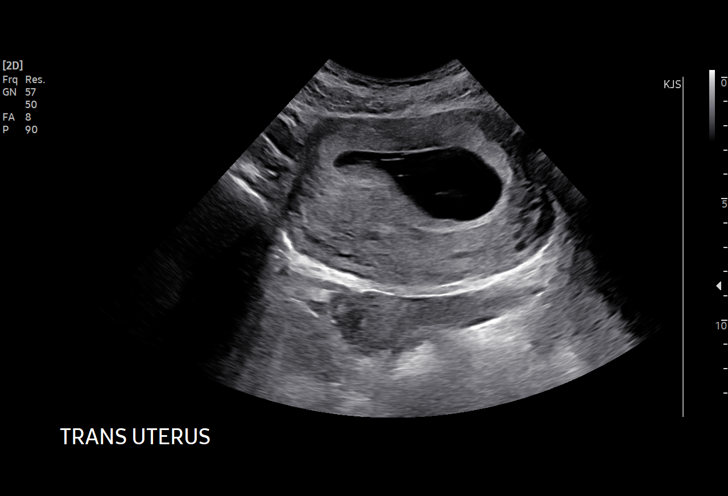
[im 15/41]
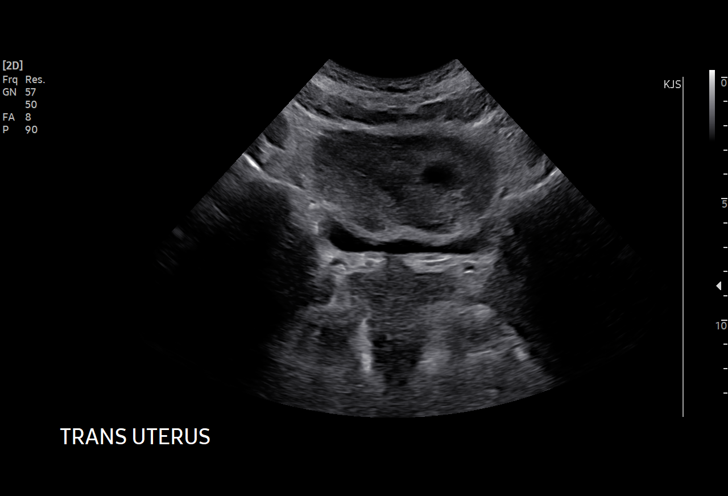
[im 18/41]
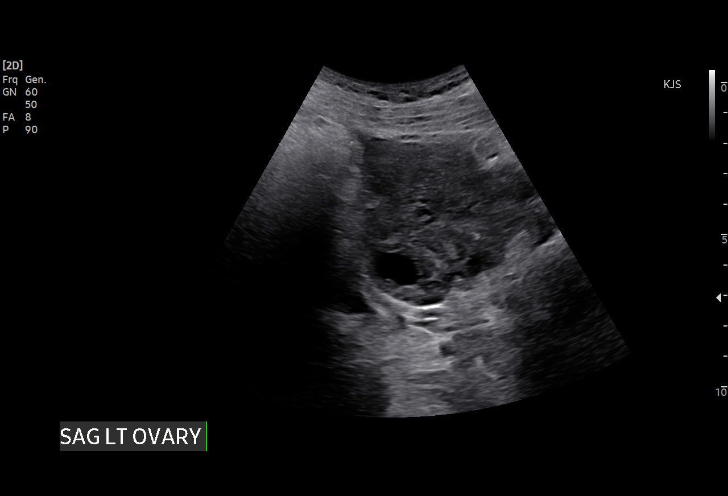
[im 21/41]
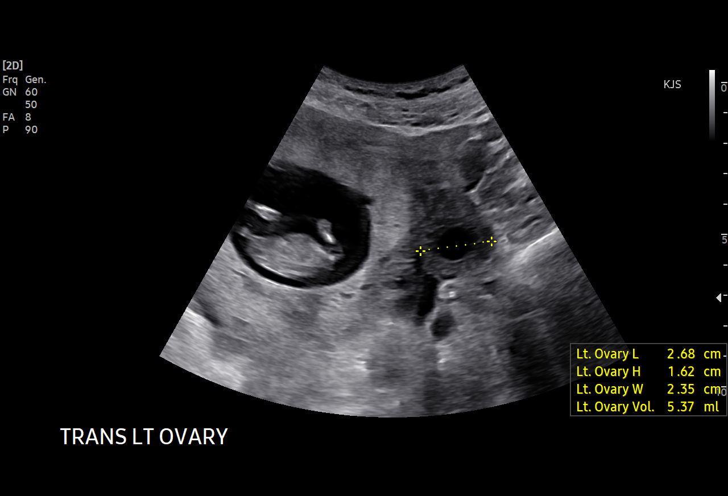
[im 23/41]
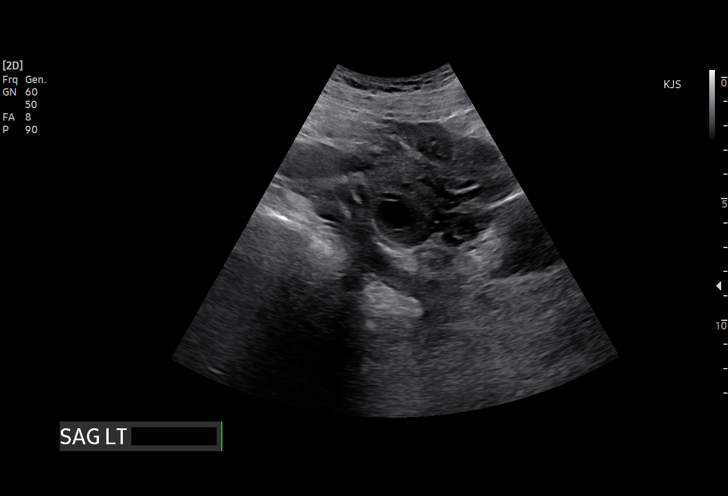
[im 26/41]
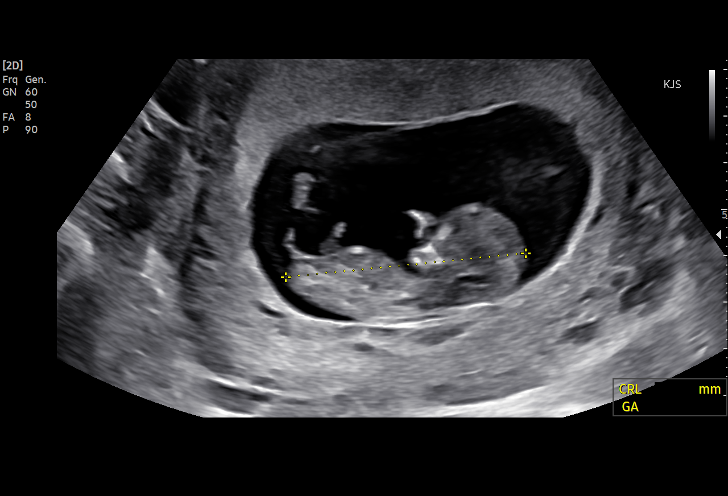
[im 29/41]
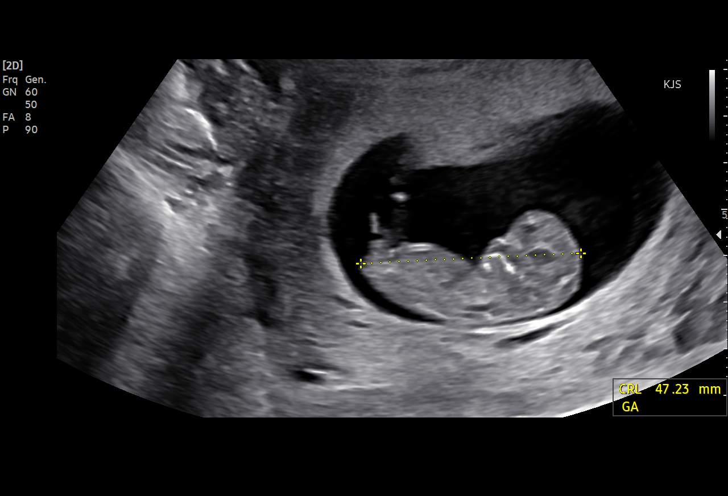
[im 32/41]
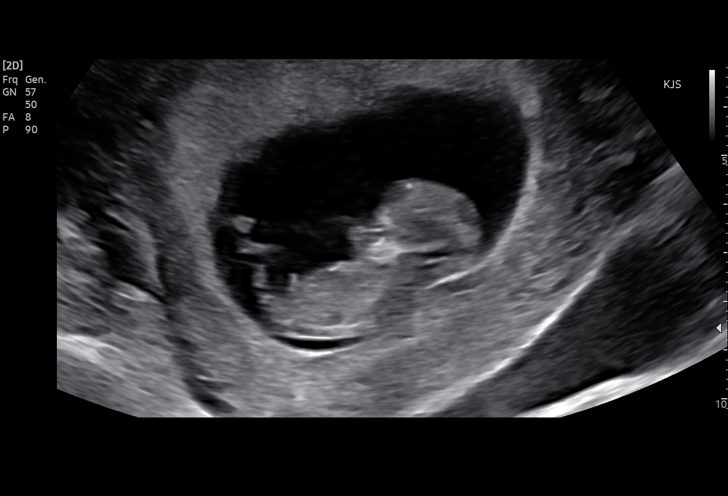
[im 35/41]
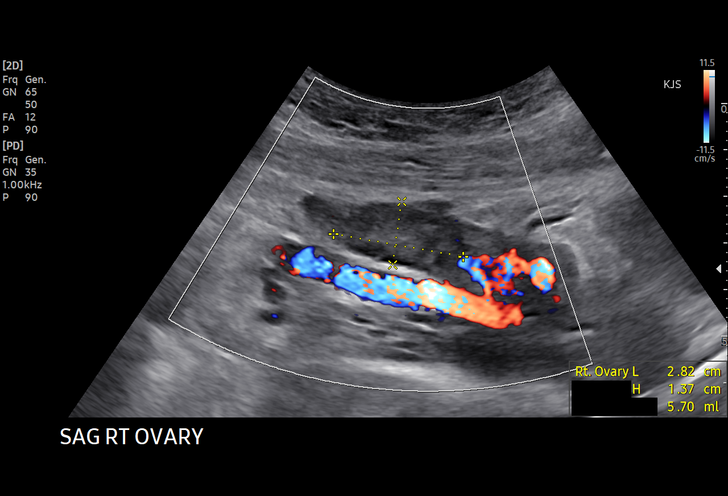
[im 38/41]
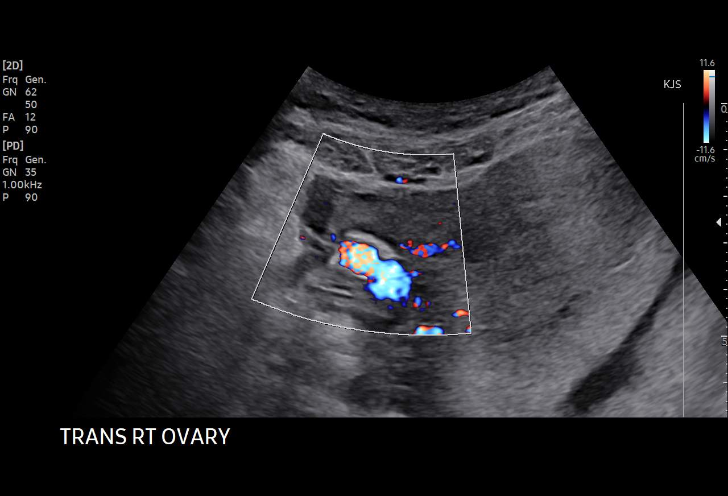
[im 41/41]
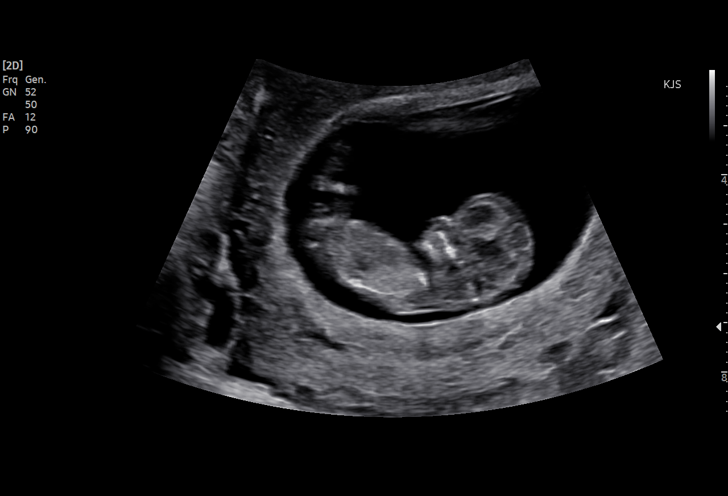

[15 of 28 positions shown; findings below may reference images not displayed]

FINDINGS: Intrauterine gestational sac: Present, single

Yolk sac:  Present

Embryo:  Present

Cardiac Activity: Present

Heart Rate: 157 bpm

CRL:   48.7 mm   11 w 5 d                  US EDC: 06/27/2021

Subchorionic hemorrhage:  None visualized.

Maternal uterus/adnexae:

Maternal uterus otherwise normal appearance.

Ovaries are unremarkable bilaterally.

No free pelvic fluid or adnexal masses.
IMPRESSION: Single live intrauterine gestation at 11 weeks 5 days EGA by
crown-rump length.

No acute abnormalities.

## 2022-11-13 IMAGING — US US OB COMP +14 WK
1 series · 15 of 28 positions shown · non-contrast
Comparison: none

CLINICAL DATA: Second trimester pregnancy for fetal anatomy survey.
Previous history of pregnancy loss due to IUGR.

EXAM:
OBSTETRICAL ULTRASOUND >14 WKS

[Series 1: us ob comp + 14 wk · 15 of 71 slices shown]
[im 1/71]
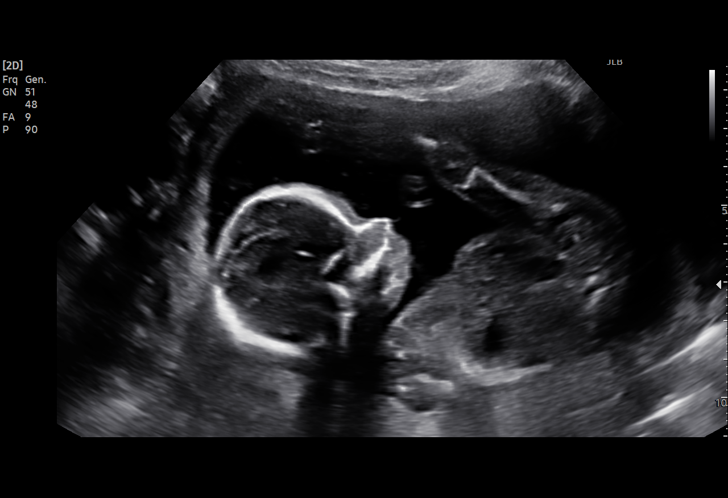
[im 6/71]
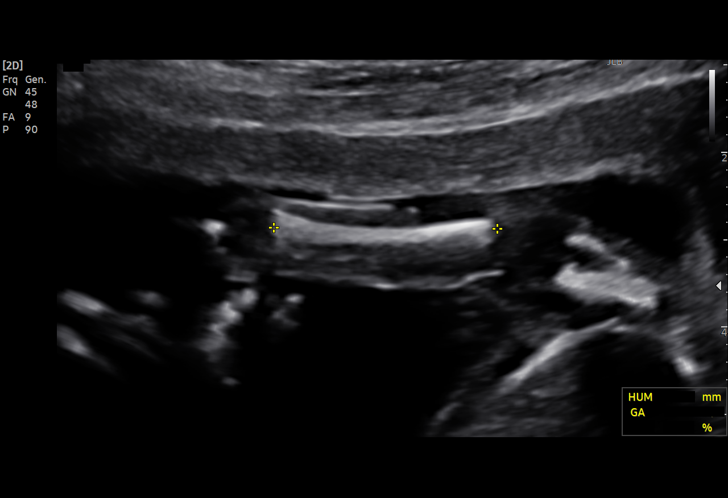
[im 11/71]
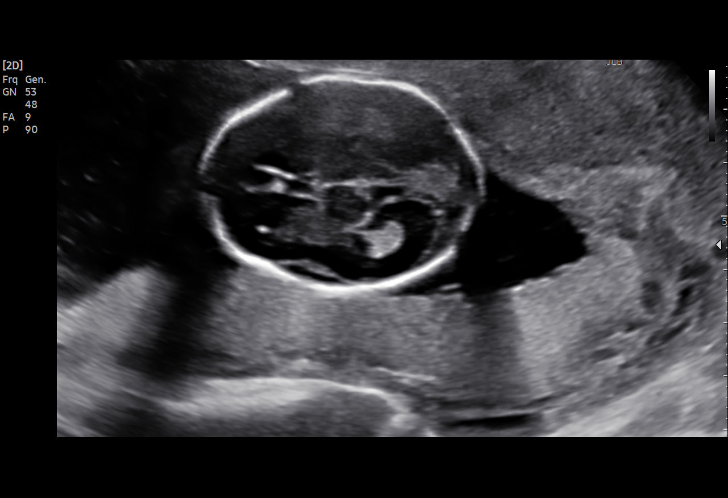
[im 16/71]
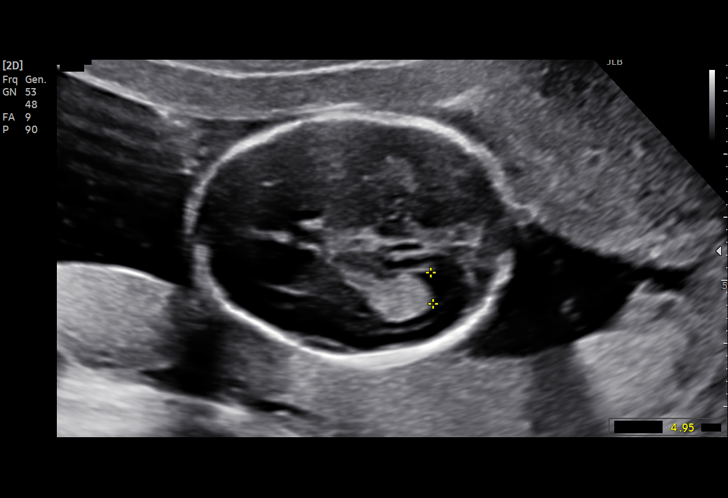
[im 21/71]
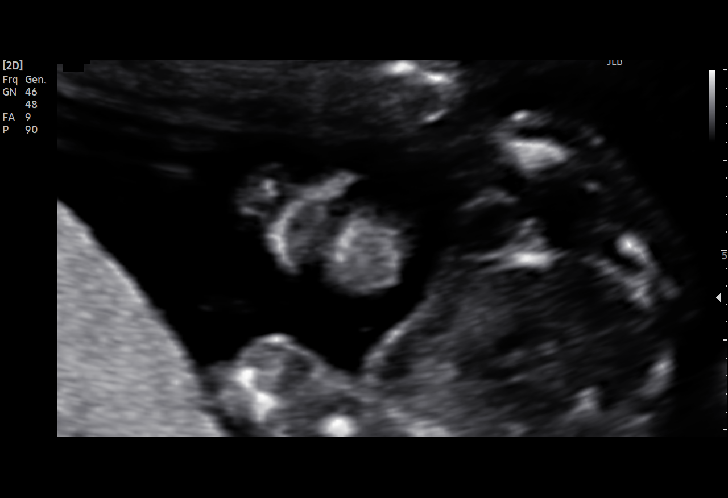
[im 26/71]
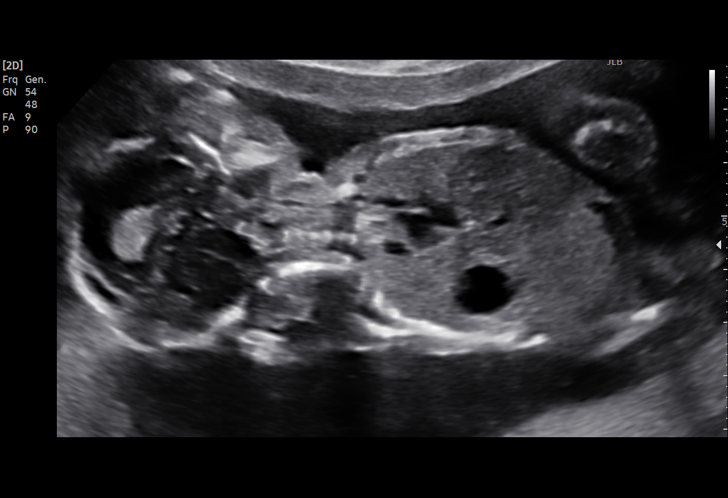
[im 32/71]
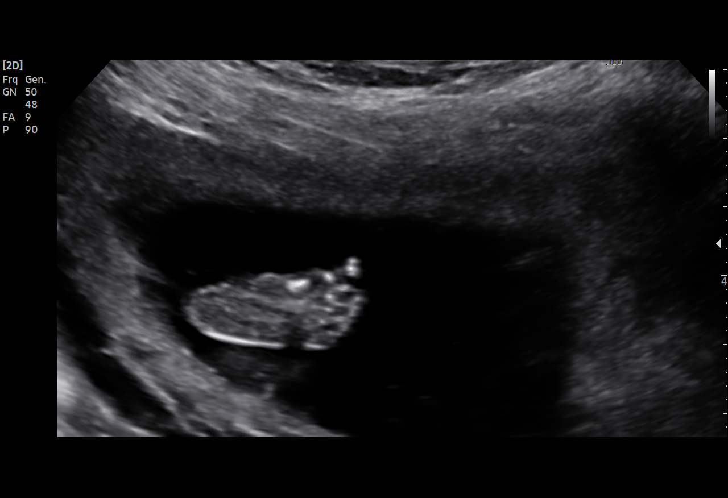
[im 37/71]
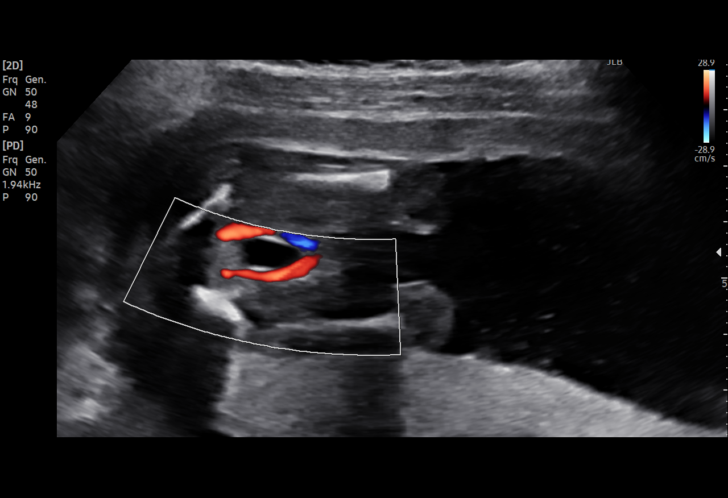
[im 39/71]
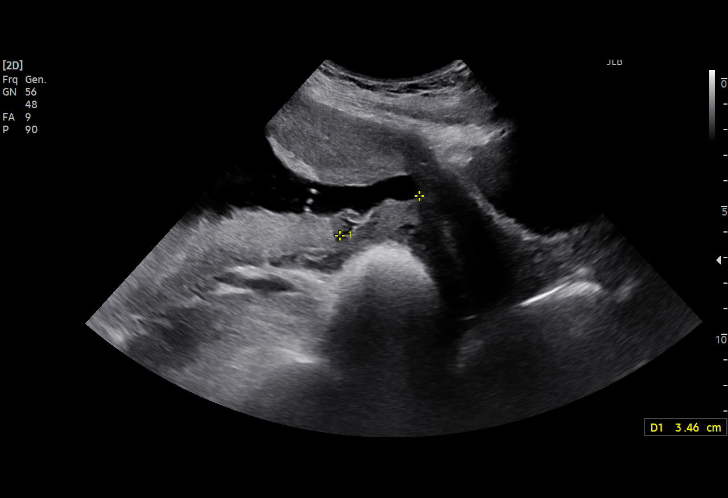
[im 45/71]
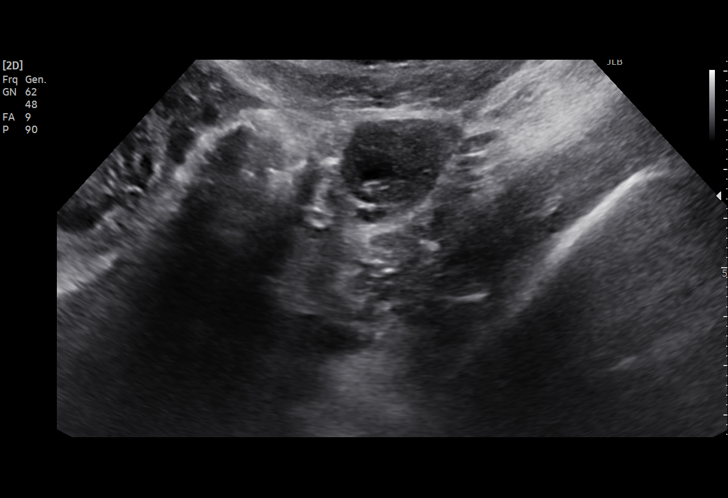
[im 50/71]
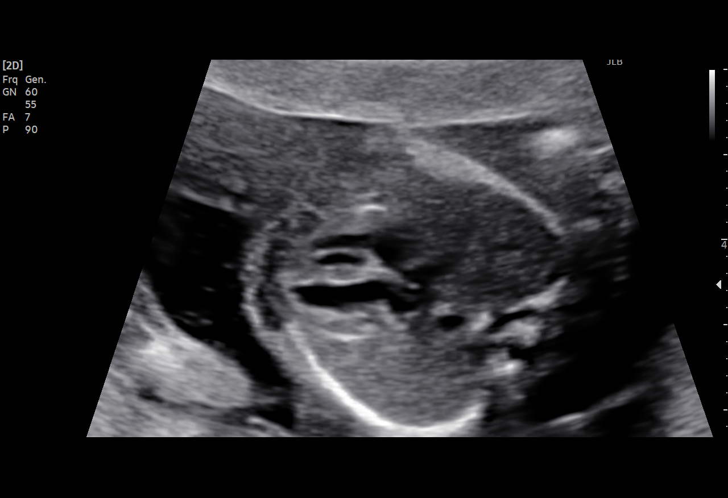
[im 55/71]
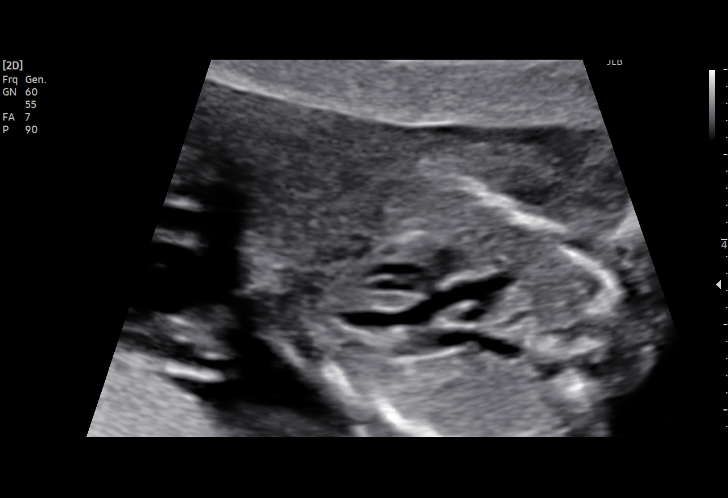
[im 60/71]
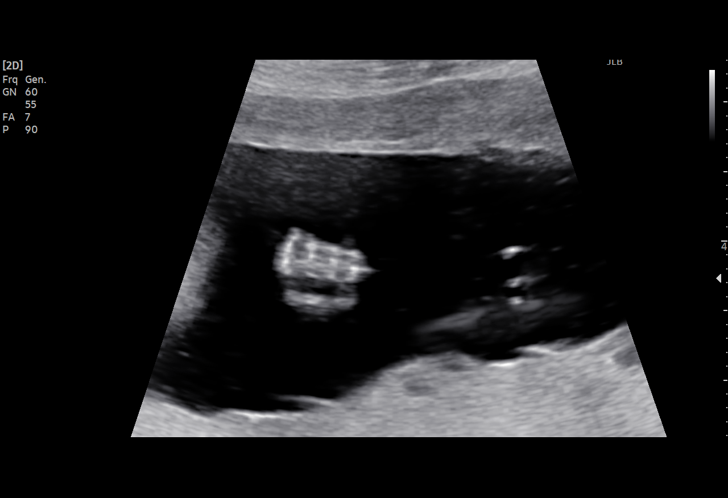
[im 65/71]
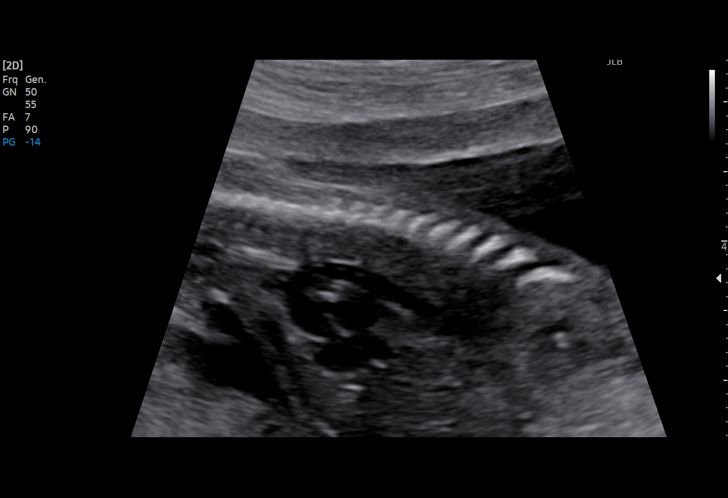
[im 71/71]
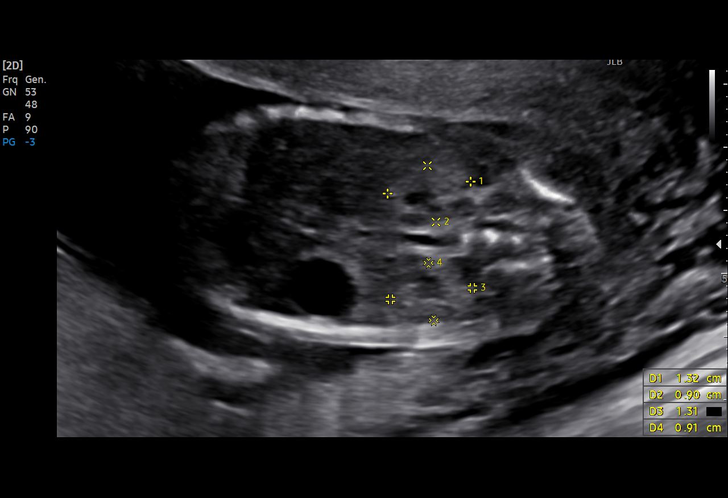

[15 of 28 positions shown; findings below may reference images not displayed]

FINDINGS: Number of Fetuses: 1

Heart Rate:  137 bpm

Movement: Yes

Presentation: Transverse lie with head to maternal right

Previa: No

Placental Location: Posterior

Amniotic Fluid (Subjective): Within normal limits

Amniotic Fluid (Objective):

Vertical pocket = 5.0cm

FETAL BIOMETRY

BPD: 4.0cm 18w 1d

HC:   14.9cm 18w 0d

AC:   13.5cm 19w 0d

FL:   2.7cm 18w 1d

Current Mean GA: 18w 2d US EDC: 06/28/2021

FETAL ANATOMY

Lateral Ventricles: Appears normal

Thalami/CSP: Appears normal

Posterior Fossa:  Appears normal

Nuchal Region: Appears normal   NFT= 3.6 mm

Upper Lip: Appears normal

Spine: Appears normal

4 Chamber Heart on Left: Appears normal

LVOT: Appears normal

RVOT: Appears normal

Stomach on Left: Appears normal

3 Vessel Cord: Appears normal

Cord Insertion site: Appears normal

Kidneys: Appears normal

Bladder: Appears normal

Extremities: Appears normal

Maternal Findings:

Cervix:  3.9 cm TA
IMPRESSION: Single living IUP with estimated gestational age of 18 weeks 2 days,
and US EDC of 06/28/2021.

Unremarkable fetal anatomic survey.  No fetal anomalies identified.

## 2023-07-22 LAB — LIPID PANEL
Cholesterol: 201 — AB (ref 0–200)
HDL: 66 (ref 35–70)
LDL Cholesterol: 115
LDl/HDL Ratio: 1.8
Triglycerides: 99 (ref 40–160)

## 2023-07-22 LAB — BASIC METABOLIC PANEL
BUN: 13 (ref 4–21)
Creatinine: 0.8 (ref 0.5–1.1)
Glucose: 101

## 2023-07-22 LAB — PROTEIN / CREATININE RATIO, URINE: Creatinine, Urine: 24

## 2023-07-22 LAB — COMPREHENSIVE METABOLIC PANEL
Albumin: 4.9 (ref 3.5–5.0)
Globulin: 2.4

## 2023-07-22 LAB — HEPATIC FUNCTION PANEL
ALT: 12 U/L (ref 7–35)
AST: 11 — AB (ref 13–35)
Alkaline Phosphatase: 44 (ref 25–125)
Bilirubin, Total: 1.4

## 2023-07-22 LAB — HEMOGLOBIN A1C: Hemoglobin A1C: 5.3

## 2023-08-06 ENCOUNTER — Telehealth: Payer: Self-pay | Admitting: Internal Medicine

## 2023-08-06 NOTE — Telephone Encounter (Signed)
Left message to schedule a new patient appointment/per Dr Darrick Huntsman.

## 2023-09-01 ENCOUNTER — Ambulatory Visit: Payer: Self-pay | Admitting: Internal Medicine

## 2023-09-01 VITALS — BP 122/68 | HR 110 | Ht 68.0 in | Wt 142.6 lb

## 2023-09-01 DIAGNOSIS — Z98891 History of uterine scar from previous surgery: Secondary | ICD-10-CM | POA: Diagnosis not present

## 2023-09-01 DIAGNOSIS — F411 Generalized anxiety disorder: Secondary | ICD-10-CM

## 2023-09-01 DIAGNOSIS — Z811 Family history of alcohol abuse and dependence: Secondary | ICD-10-CM | POA: Diagnosis not present

## 2023-09-01 DIAGNOSIS — R002 Palpitations: Secondary | ICD-10-CM

## 2023-09-01 NOTE — Assessment & Plan Note (Signed)
 Anxiety related.  Checking TSH ,  CBC

## 2023-09-01 NOTE — Progress Notes (Unsigned)
   Subjective:  Patient ID: Krista Hancock, female    DOB: 1990/08/14  Age: 33 y.o. MRN: [id number]  CC: There were no encounter diagnoses.  HPI Krista Hancock presents for establishment of care.  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  .runs for exercise,  almost daily    averages 3-4 days  per week for one mile.  Trying to conceive 3rd child. ..  H/o IBS-C,  currently not an issue.  No results found for: "TSH"         History Fe has a past medical history of Anxiety, Family history of adverse reaction to anesthesia, GERD (gastroesophageal reflux disease), High cholesterol, and IUGR (intrauterine growth restriction) in prior pregnancy, pregnant.   She has a past surgical history that includes Cesarean section; Appendectomy; Fracture surgery (Right); and Cesarean section (N/A, 06/23/2021).   Her family history includes ADD / ADHD in her mother; Alcohol abuse in her father; Anxiety disorder in her brother, father, and mother; Arthritis in her father and mother; Bladder Cancer in her paternal grandfather; Cancer in her paternal grandfather; Colon polyps in her maternal grandmother and mother; Depression in her father and mother; Healthy in her brother; Heart disease in her father, mother, and paternal grandmother; Hyperlipidemia in her father and mother; Hypertension in her father, mother, and paternal grandmother; Stroke in her paternal grandmother.She reports that she has never smoked. She has never used smokeless tobacco. She reports that she does not currently use alcohol. She reports that she does not use drugs.  Outpatient Medications Prior to Visit  Medication Sig Dispense Refill   ibuprofen (ADVIL) 600 MG tablet Take 1 tablet (600 mg total) by mouth  every 6 (six) hours. (Patient not taking: Reported on 02/17/2022) 30 tablet 0   No facility-administered medications prior to visit.    Review of Systems:  Patient denies headache, fevers, malaise, unintentional weight loss, skin rash, eye pain, sinus congestion and sinus pain, sore throat, dysphagia,  hemoptysis , cough, dyspnea, wheezing, chest pain, palpitations, orthopnea, edema, abdominal pain, nausea, melena, diarrhea, constipation, flank pain, dysuria, hematuria, urinary  Frequency, nocturia, numbness, tingling, seizures,  Focal weakness, Loss of consciousness,  Tremor, insomnia, depression, anxiety, and suicidal ideation.     Objective:  BP 122/68   Pulse (!) 110   Ht 5\' 8"  (1.727 m)   Wt 142 lb 9.6 oz (64.7 kg)   SpO2 99%   BMI 21.68 kg/m   Physical Exam  Assessment & Plan:  There are no diagnoses linked to this encounter.   Follow-up: No follow-ups on file.   I provided 30 minutes during this encounter reviewing patient's last visit with previous provider,  most recent imaging studies and labs.  Provided counseling on the above mentioned problems and coordination of care.   Sherlene Shams, MD

## 2023-09-01 NOTE — Patient Instructions (Addendum)
 Nice to meet you!  We are checking your thyroid and CBC today bc they weren't done in January  Repeat the liver panel today as well.  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

## 2023-09-02 DIAGNOSIS — Z98891 History of uterine scar from previous surgery: Secondary | ICD-10-CM | POA: Insufficient documentation

## 2023-09-02 DIAGNOSIS — F411 Generalized anxiety disorder: Secondary | ICD-10-CM | POA: Insufficient documentation

## 2023-09-02 DIAGNOSIS — Z811 Family history of alcohol abuse and dependence: Secondary | ICD-10-CM | POA: Insufficient documentation

## 2023-09-02 LAB — CBC WITH DIFFERENTIAL/PLATELET
Basophils Absolute: 0.1 10*3/uL (ref 0.0–0.1)
Basophils Relative: 1 % (ref 0.0–3.0)
Eosinophils Absolute: 0.1 10*3/uL (ref 0.0–0.7)
Eosinophils Relative: 1.1 % (ref 0.0–5.0)
HCT: 40.6 % (ref 36.0–46.0)
Hemoglobin: 13.6 g/dL (ref 12.0–15.0)
Lymphocytes Relative: 23.6 % (ref 12.0–46.0)
Lymphs Abs: 2 10*3/uL (ref 0.7–4.0)
MCHC: 33.5 g/dL (ref 30.0–36.0)
MCV: 95.9 fl (ref 78.0–100.0)
Monocytes Absolute: 0.4 10*3/uL (ref 0.1–1.0)
Monocytes Relative: 4.6 % (ref 3.0–12.0)
Neutro Abs: 5.9 10*3/uL (ref 1.4–7.7)
Neutrophils Relative %: 69.7 % (ref 43.0–77.0)
Platelets: 212 10*3/uL (ref 150.0–400.0)
RBC: 4.24 Mil/uL (ref 3.87–5.11)
RDW: 12.5 % (ref 11.5–15.5)
WBC: 8.5 10*3/uL (ref 4.0–10.5)

## 2023-09-02 LAB — COMPREHENSIVE METABOLIC PANEL
ALT: 8 U/L (ref 0–35)
AST: 10 U/L (ref 0–37)
Albumin: 4.6 g/dL (ref 3.5–5.2)
Alkaline Phosphatase: 34 U/L — ABNORMAL LOW (ref 39–117)
BUN: 14 mg/dL (ref 6–23)
CO2: 27 meq/L (ref 19–32)
Calcium: 9.4 mg/dL (ref 8.4–10.5)
Chloride: 106 meq/L (ref 96–112)
Creatinine, Ser: 0.76 mg/dL (ref 0.40–1.20)
GFR: 103.32 mL/min (ref >60.00)
Glucose, Bld: 104 mg/dL — ABNORMAL HIGH (ref 70–99)
Potassium: 3.7 meq/L (ref 3.5–5.1)
Sodium: 141 meq/L (ref 135–145)
Total Bilirubin: 1.1 mg/dL (ref 0.2–1.2)
Total Protein: 7 g/dL (ref 6.0–8.3)

## 2023-09-02 LAB — TSH: TSH: 0.82 u[IU]/mL (ref 0.35–5.50)

## 2023-09-02 NOTE — Assessment & Plan Note (Signed)
 Reviewed her history of alcohl abuse during college and her current level of intake.  Counselled on moderation

## 2023-09-02 NOTE — Assessment & Plan Note (Signed)
 She declines pharmacotherapy for now

## 2023-09-02 NOTE — Assessment & Plan Note (Addendum)
 She is trying to conceive her third and last anticipated child and will need to discuss with OBGYN whether she can have a third c sec

## 2023-09-04 ENCOUNTER — Encounter: Payer: Self-pay | Admitting: Internal Medicine

## 2024-02-14 ENCOUNTER — Encounter: Payer: Self-pay | Admitting: Internal Medicine

## 2024-02-14 ENCOUNTER — Telehealth (INDEPENDENT_AMBULATORY_CARE_PROVIDER_SITE_OTHER)

## 2024-02-14 VITALS — Ht 68.0 in | Wt 142.6 lb

## 2024-02-14 VITALS — BP 98/68 | Ht 68.0 in | Wt 142.6 lb

## 2024-02-14 DIAGNOSIS — F411 Generalized anxiety disorder: Secondary | ICD-10-CM

## 2024-02-14 MED ORDER — SERTRALINE HCL 25 MG PO TABS
25.0000 mg | ORAL_TABLET | Freq: Every day | ORAL | 0 refills | Status: DC
Start: 2024-02-14 — End: 2024-04-11

## 2024-02-14 NOTE — Progress Notes (Signed)
 Virtual Visit via Video Note  I connected with Krista Hancock on 02/14/24 at  4:00 PM EDT by a video enabled telemedicine application and verified that I am speaking with the correct person using two identifiers.  Patient Location: Home Provider Location: Office/Clinic  I discussed the limitations, risks, security, and privacy concerns of performing an evaluation and management service by video and the availability of in person appointments. I also discussed with the patient that there may be a patient responsible charge related to this service. The patient expressed understanding and agreed to proceed.  Subjective: PCP: Marylynn Verneita CROME, MD  Chief Complaint  Patient presents with   Anxiety   HPI Patient presenting via video-visit to discuss ongoing anxiety, depression which has worsened due to grief surrounding loss of loved ones. Patient reports of having h/o GAD diagnosed when she was about 33 years of age. She was treated with Citalopram in the past (stopped about 10 years ago). She did not like how she felt when she was taking Citalopram.  She is interested in pharmacological intervention to help with her symptoms.   Patient reports sleep can be better. She has hard time falling back asleep due to racing thoughts. She also has a toddler at home which contributes to not being able to sleep through the night. She exercises regularly. Drinks wine 2-3 beverages couple times a week. She eats fairly healthy diet. Denies thought of harming herself or others. She and  her husband are trying to conceive. She has a family history significant for anxiety, depression in parents and her siblings.    ROS: Per HPI  Current Outpatient Medications:    sertraline  (ZOLOFT ) 25 MG tablet, Take 1 tablet (25 mg total) by mouth daily., Disp: 60 tablet, Rfl: 0  Observations/Objective: Today's Vitals   02/14/24 1543  Weight: 142 lb 9.6 oz (64.7 kg)  Height: 5' 8 (1.727 m)   Physical Exam Psychiatric:         Mood and Affect: Mood is anxious and depressed. Affect is tearful.        Behavior: Behavior is cooperative.        Thought Content: Thought content does not include homicidal or suicidal ideation. Thought content does not include homicidal or suicidal plan.     Assessment and Plan: GAD (generalized anxiety disorder) Assessment & Plan: PHQ-9, GAD-7 results reviewed.  Discussed cognitive behavioral therapy, pharmacotherapy to help with her symptoms. Declines CBT.  Given she is trying to conceive recommend Sertraline  25 mg once daily with f/u in 4-6 weeks for reevaluation.  If SI/HI recommend stopping medication and seeing immediate medical care. Patient verbalized understanding and aggress with above plan.    Orders: -     Sertraline  HCl; Take 1 tablet (25 mg total) by mouth daily.  Dispense: 60 tablet; Refill: 0       02/14/2024    3:44 PM 09/01/2023    2:17 PM  PHQ9 SCORE ONLY  PHQ-9 Total Score 8 0      02/14/2024    3:47 PM  GAD 7 : Generalized Anxiety Score  Nervous, Anxious, on Edge 3  Control/stop worrying 3  Worry too much - different things 3  Trouble relaxing 3  Restless 1  Easily annoyed or irritable 1  Afraid - awful might happen 1  Total GAD 7 Score 15  Anxiety Difficulty Somewhat difficult     Follow Up Instructions: Return in about 4 weeks (around 03/13/2024), or 4-6 weeks with PCP fro GAD  f/u.   I discussed the assessment and treatment plan with the patient. The patient was provided an opportunity to ask questions, and all were answered. The patient agreed with the plan and demonstrated an understanding of the instructions.   The patient was advised to call back or seek an in-person evaluation if the symptoms worsen or if the condition fails to improve as anticipated.  The above assessment and management plan was discussed with the patient. The patient verbalized understanding of and has agreed to the management plan.   Luke Shade, MD

## 2024-02-14 NOTE — Assessment & Plan Note (Addendum)
 PHQ-9, GAD-7 results reviewed.  Discussed cognitive behavioral therapy, pharmacotherapy to help with her symptoms. Declines CBT.  Given she is trying to conceive recommend Sertraline  25 mg once daily with f/u in 4-6 weeks for reevaluation.  If SI/HI recommend stopping medication and seeing immediate medical care. Patient verbalized understanding and aggress with above plan.

## 2024-02-14 NOTE — Telephone Encounter (Signed)
 Virtual schedule with Dr. Abbey to discuss, pt unable to come into office due to working from home and children being home. Pt is aware she will be asked phq and gad

## 2024-02-15 ENCOUNTER — Encounter: Payer: Self-pay | Admitting: Internal Medicine

## 2024-04-10 ENCOUNTER — Other Ambulatory Visit: Payer: Self-pay

## 2024-04-10 DIAGNOSIS — F411 Generalized anxiety disorder: Secondary | ICD-10-CM

## 2024-05-05 ENCOUNTER — Ambulatory Visit (INDEPENDENT_AMBULATORY_CARE_PROVIDER_SITE_OTHER)

## 2024-05-05 VITALS — BP 120/83 | HR 88 | Resp 16 | Ht 68.0 in | Wt 149.3 lb

## 2024-05-05 DIAGNOSIS — Z3201 Encounter for pregnancy test, result positive: Secondary | ICD-10-CM

## 2024-05-05 DIAGNOSIS — O3680X0 Pregnancy with inconclusive fetal viability, not applicable or unspecified: Secondary | ICD-10-CM

## 2024-05-05 DIAGNOSIS — N912 Amenorrhea, unspecified: Secondary | ICD-10-CM

## 2024-05-05 LAB — POCT URINE PREGNANCY: Preg Test, Ur: POSITIVE — AB

## 2024-05-05 NOTE — Patient Instructions (Addendum)
 Morning Sickness Morning sickness is when you throw up or feel like you may throw up during pregnancy. This condition often occurs in the morning, but it can also occur at any time of day. Morning sickness is most common during the first three months of pregnancy, but it can go on throughout the pregnancy. Morning sickness is usually harmless. But if you throw up all the time, you should see your health care provider. You may also hear this condition called nausea and vomiting of pregnancy. What are the causes? The cause of morning sickness is not known. It may be linked to changes in hormones during pregnancy. What increases the risk? You're more likely to have morning sickness if: You had morning sickness in another pregnancy. You're pregnant with more than one baby, such as twins. You had morning sickness in other pregnancies. You have had motion sickness before you were pregnant. You have had bad headaches or migraines before you were pregnant. What are the signs or symptoms? Symptoms of morning sickness include: Feeling like you may throw up. Throwing up. How is this diagnosed? Morning sickness is diagnosed based on your symptoms. How is this treated? Treatment is usually not needed for morning sickness. You may only need to change what you eat. In some cases, your provider may give you: Vitamin B6 supplements. Medicines to prevent throwing up. Ginger. Follow these instructions at home: Medicines Take your medicines only as told by your provider. Do not use any prescription, over-the-counter, or herbal medicines for morning sickness without first talking with your provider. Take prenatal vitamins. These can stop or lessen the symptoms of morning sickness. If you feel like you may throw up after taking prenatal vitamins, take them at night or with a snack. Eating and drinking     Eat dry toast or crackers before getting out of bed. Eat 5 or 6 small meals a day. Try ginger ale  made with real ginger, ginger tea, or ginger candies. Drink fluids throughout the day. Eat protein foods when you need a snack. Nuts, yogurt, and cheese are good choices. Eat dry and bland foods like rice or baked potatoes. Foods that are high in carbohydrates are often helpful. Have someone cook for you if the smell of food makes you want to throw up. Foods to avoid Greasy foods. Fatty foods. Spicy foods. General instructions Try to avoid smells that make you feel sick. Use an air purifier to keep the air in your house free of smells. Try using an acupressure wristband. This is a wristband that's used to treat motion sickness. Try acupuncture. In this treatment, a provider puts thin needles into certain areas of your body to make you feel better. Brush your teeth after throwing up or rinse with a mix of baking soda and water. The acid in throw-up can hurt your teeth. Contact a health care provider if: Your symptoms do not get better. You feel dizzy or light-headed. You're losing weight. Get help right away if: The feeling that you may throw up will not go away, or you can't stop throwing up. You faint. You have very bad pain in your belly. This information is not intended to replace advice given to you by your health care provider. Make sure you discuss any questions you have with your health care provider. Document Revised: 03/25/2023 Document Reviewed: 10/01/2022 Elsevier Patient Education  2024 Elsevier Inc.First Trimester of Pregnancy  The first trimester of pregnancy starts on the first day of your last monthly period  until the end of week 13. This is months 1 through 3 of pregnancy. A week after a sperm fertilizes an egg, the egg will implant into the wall of the uterus and begin to develop into a baby. Body changes during your first trimester Your body goes through many changes during pregnancy. The changes usually return to normal after your baby is born. Physical  changes Your breasts may grow larger and may hurt. The area around your nipples may get darker. Your periods will stop. Your hair and nails may grow faster. You may pee more often. Health changes You may tire easily. Your gums may bleed and may be sensitive when you brush and floss. You may not feel hungry. You may have heartburn. You may throw up or feel like you may throw up. You may want to eat some foods, but not others. You may have headaches. You may have trouble pooping (constipation). Other changes Your emotions may change from day to day. You may have more dreams. Follow these instructions at home: Medicines Talk to your health care provider if you're taking medicines. Ask if the medicines are safe to take during pregnancy. Your provider may change the medicines that you take. Do not take any medicines unless told to by your provider. Take a prenatal vitamin that has at least 600 micrograms (mcg) of folic acid. Do not use herbal medicines, illegal substances, or medicines that are not approved by your provider. Eating and drinking While you're pregnant your body needs extra food for your growing baby. Talk with your provider about what to eat while pregnant. Activity Most women are able to exercise during pregnancy. Exercises may need to change as your pregnancy goes on. Talk to your provider about your activities and exercise routines. Relieving pain and discomfort Wear a good, supportive bra if your breasts hurt. Rest with your legs raised if you have leg cramps or low back pain. Safety Wear your seatbelt at all times when you're in a car. Talk to your provider if someone hits you, hurts you, or yells at you. Talk with your provider if you're feeling sad or have thoughts of hurting yourself. Lifestyle Certain things can be harmful while you're pregnant. Follow these rules: Do not use hot tubs, steam rooms, or saunas. Do not douche. Do not use tampons or scented  pads. Do not drink alcohol,smoke, vape, or use products with nicotine or tobacco in them. If you need help quitting, talk with your provider. Avoid cat litter boxes and soil used by cats. These things carry germs that can cause harm to your pregnancy and your baby. General instructions Keep all follow-up visits. It helps you and your unborn baby stay as healthy as possible. Write down your questions. Take them to your visits. Your provider will: Talk with you about your overall health. Give you advice or refer you to specialists who can help with different needs, including: Prenatal education classes. Mental health and counseling. Foods and healthy eating. Ask for help if you need help with food. Call your dentist and ask to be seen. Brush your teeth with a soft toothbrush. Floss gently. Where to find more information American Pregnancy Association: americanpregnancy.org [employer]: acog.org Office on [employer] Health: [employer] Contact a health care provider if: You feel dizzy, faint, or have a fever. You vomit or have watery poop (diarrhea) for 2 days or more. You have abnormal discharge or bleeding from your vagina. You have pain when you pee or  your pee smells bad. You have cramps, pain, or pressure in your belly area. Get help right away if: You have trouble breathing or chest pain. You have any kind of injury, such as from a fall or a car crash. These symptoms may be an emergency. Get help right away. Call 911. Do not wait to see if the symptoms will go away. Do not drive yourself to the hospital. This information is not intended to replace advice given to you by your health care provider. Make sure you discuss any questions you have with your health care provider. Document Revised: 03/25/2023 Document Reviewed: 10/23/2022 Elsevier Patient Education  2024 Elsevier Inc.Commonly Asked Questions During Pregnancy  Cats: A parasite can be  excreted in cat feces.  To avoid exposure you need to have another person empty the little box.  If you must empty the litter box you will need to wear gloves.  Wash your hands after handling your cat.  This parasite can also be found in raw or undercooked meat so this should also be avoided.  Colds, Sore Throats, Flu: Please check your medication sheet to see what you can take for symptoms.  If your symptoms are unrelieved by these medications please call the office.  Dental Work: Most any dental work Agricultural consultant recommends is permitted.  X-rays should only be taken during the first trimester if absolutely necessary.  Your abdomen should be shielded with a lead apron during all x-rays.  Please notify your provider prior to receiving any x-rays.  Novocaine is fine; gas is not recommended.  If your dentist requires a note from [place] prior to dental work please call the office and we will provide one for you.  Exercise: Exercise is an important part of staying healthy during your pregnancy.  You may continue most exercises you were accustomed to prior to pregnancy.  Later in your pregnancy you will most likely notice you have difficulty with activities requiring balance like riding a bicycle.  It is important that you listen to your body and avoid activities that put you at a higher risk of falling.  Adequate rest and staying well hydrated are a must!  If you have questions about the safety of specific activities ask your provider.    Exposure to Children with illness: Try to avoid obvious exposure; report any symptoms to [place] when noted,  If you have chicken pos, red measles or mumps, you should be immune to these diseases.   Please do not take any vaccines while pregnant unless you have checked with your OB provider.  Fetal Movement: After 28 weeks we recommend you do "kick counts" twice daily.  Lie or sit down in a calm quiet environment and count your baby movements "kicks".  You should feel your baby at  least 10 times per hour.  If you have not felt 10 kicks within the first hour get up, walk around and have something sweet to eat or drink then repeat for an additional hour.  If count remains less than 10 per hour notify your provider.  Fumigating: Follow your pest control agent's advice as to how long to stay out of your home.  Ventilate the area well before re-entering.  Hemorrhoids:   Most over-the-counter preparations can be used during pregnancy.  Check your medication to see what is safe to use.  It is important to use a stool softener or fiber in your diet and to drink lots of liquids.  If hemorrhoids seem to be  getting worse please call the office.   Hot Tubs:  Hot tubs Jacuzzis and saunas are not recommended while pregnant.  These increase your internal body temperature and should be avoided.  Intercourse:  Sexual intercourse is safe during pregnancy as long as you are comfortable, unless otherwise advised by your provider.  Spotting may occur after intercourse; report any bright red bleeding that is heavier than spotting.  Labor:  If you know that you are in labor, please go to the hospital.  If you are unsure, please call the office and let us help you decide what to do.  Lifting, straining, etc:  If your job requires heavy lifting or straining please check with your provider for any limitations.  Generally, you should not lift items heavier than that you can lift simply with your hands and arms (no back muscles)  Painting:  Paint fumes do not harm your pregnancy, but may make you ill and should be avoided if possible.  Latex or water based paints have less odor than oils.  Use adequate ventilation while painting.  Permanents & Hair Color:  Chemicals in hair dyes are not recommended as they cause increase hair dryness which can increase hair loss during pregnancy.  " Highlighting" and permanents are allowed.  Dye may be absorbed differently and permanents may not hold as well during  pregnancy.  Sunbathing:  Use a sunscreen, as skin burns easily during pregnancy.  Drink plenty of fluids; avoid over heating.  Tanning Beds:  Because their possible side effects are still unknown, tanning beds are not recommended.  Ultrasound Scans:  Routine ultrasounds are performed at approximately 20 weeks.  You will be able to see your baby's general anatomy an if you would like to know the gender this can usually be determined as well.  If it is questionable when you conceived you may also receive an ultrasound early in your pregnancy for dating purposes.  Otherwise ultrasound exams are not routinely performed unless there is a medical necessity.  Although you can request a scan we ask that you pay for it when conducted because insurance does not cover " patient request" scans.  Work: If your pregnancy proceeds without complications you may work until your due date, unless your physician or employer advises otherwise.  Round Ligament Pain/Pelvic Discomfort:  Sharp, shooting pains not associated with bleeding are fairly common, usually occurring in the second trimester of pregnancy.  They tend to be worse when standing up or when you remain standing for long periods of time.  These are the result of pressure of certain pelvic ligaments called "round ligaments".  Rest, Tylenol and heat seem to be the most effective relief.  As the womb and fetus grow, they rise out of the pelvis and the discomfort improves.  Please notify the office if your pain seems different than that described.  It may represent a more serious condition.  Common Medications Safe in Pregnancy  Acne:      Constipation:  Benzoyl Peroxide     Colace  Clindamycin      Dulcolax Suppository  Topica Erythromycin     Fibercon  Salicylic Acid      Metamucil         Miralax AVOID:        Senakot   Accutane    Cough:  Retin-A       Cough Drops  Tetracycline      Phenergan w/ Codeine if Rx  Minocycline  Robitussin (Plain &  DM)  Antibiotics:     Crabs/Lice:  Ceclor       RID  Cephalosporins    AVOID:  E-Mycins      Kwell  Keflex  Macrobid/Macrodantin   Diarrhea:  Penicillin      Kao-Pectate  Zithromax      Imodium AD         PUSH FLUIDS AVOID:       Cipro     Fever:  Tetracycline      Tylenol (Regular or Extra  Minocycline       Strength)  Levaquin      Extra Strength-Do not          Exceed 8 tabs/24 hrs Caffeine:        200mg /day (equiv. To 1 cup of coffee or  approx. 3 12 oz sodas)         Gas: Cold/Hayfever:       Gas-X  Benadryl      Mylicon  Claritin       Phazyme  **Claritin-D        Chlor-Trimeton    Headaches:  Dimetapp      ASA-Free Excedrin  Drixoral-Non-Drowsy     Cold Compress  Mucinex (Guaifenasin)     Tylenol (Regular or Extra  Sudafed/Sudafed-12 Hour     Strength)  **Sudafed PE Pseudoephedrine   Tylenol Cold & Sinus     Vicks Vapor Rub  Zyrtec  **AVOID if Problems With Blood Pressure         Heartburn: Avoid lying down for at least 1 hour after meals  Aciphex      Maalox     Rash:  Milk of Magnesia     Benadryl    Mylanta       1% Hydrocortisone Cream  Pepcid  Pepcid Complete   Sleep Aids:  Prevacid      Ambien   Prilosec       Benadryl  Rolaids       Chamomile Tea  Tums (Limit 4/day)     Unisom         Tylenol PM         Warm milk-add vanilla or  Hemorrhoids:       Sugar for taste  Anusol/Anusol H.C.  (RX: Analapram 2.5%)  Sugar Substitutes:  Hydrocortisone OTC     Ok in moderation  Preparation H      Tucks        Vaseline lotion applied to tissue with wiping    Herpes:     Throat:  Acyclovir      Oragel  Famvir  Valtrex     Vaccines:         Flu Shot Leg Cramps:       *Gardasil  Benadryl      Hepatitis A         Hepatitis B Nasal Spray:       Pneumovax  Saline Nasal Spray     Polio Booster         Tetanus Nausea:       Tuberculosis test or PPD  Vitamin B6 25 mg TID   AVOID:    Dramamine      *Gardasil  Emetrol       Live Poliovirus  Ginger  Root 250 mg QID    MMR (measles, mumps &  High Complex Carbs @ Bedtime    rebella)  Sea Bands-Accupressure    Varicella (Chickenpox)  Unisom 1/2  tab TID     *No known complications           If received before Pain:         Known pregnancy;   Darvocet       Resume series after  Lortab        Delivery  Percocet    Yeast:   Tramadol      Femstat  Tylenol 3      Gyne-lotrimin  Ultram       Monistat  Vicodin           MISC:         All Sunscreens           Hair Coloring/highlights          Insect Repellant's          (Including DEET)         Mystic Tans

## 2024-05-05 NOTE — Progress Notes (Signed)
    NURSE VISIT NOTE  Subjective:    Patient ID: Krista Hancock, female    DOB: 11-03-1990, 33 y.o.   MRN: [id number]  HPI  Patient is a 33 y.o. G63P2002 female who presents for evaluation of amenorrhea. She believes she could be pregnant. Pregnancy is desired. Sexual Activity: single partner, contraception: none. Current symptoms also include: breast tenderness, fatigue, and positive home pregnancy test. Last period was normal.    Objective:    BP 120/83   Pulse 88   Ht 5' 8 (1.727 m)   LMP 03/30/2024 (Approximate)   BMI 21.68 kg/m   Lab Review  Results for orders placed or performed in visit on 05/05/24  POCT urine pregnancy  Result Value Ref Range   Preg Test, Ur Positive (A) Negative    Assessment:   1. Amenorrhea     Plan:   Pregnancy Test: Positive  Estimated Date of Delivery: None noted. BP Cuff Measurement taken. Cuff Size Adult Small Encouraged well-balanced diet, plenty of rest when needed, pre-natal vitamins daily and walking for exercise.  Discussed self-help for nausea, avoiding OTC medications until consulting provider or pharmacist, other than Tylenol  as needed, minimal caffeine (1-2 cups daily) and avoiding alcohol.   She will schedule her nurse visit @ 7-[redacted] wks pregnant, u/s for dating @10  wk, and NOB visit at [redacted] wk pregnant.    Feel free to call with any questions.   Camelia Fetters, CMA [facility]

## 2024-05-19 ENCOUNTER — Telehealth (INDEPENDENT_AMBULATORY_CARE_PROVIDER_SITE_OTHER)

## 2024-05-19 DIAGNOSIS — F32A Depression, unspecified: Secondary | ICD-10-CM | POA: Insufficient documentation

## 2024-05-19 DIAGNOSIS — Z3480 Encounter for supervision of other normal pregnancy, unspecified trimester: Secondary | ICD-10-CM | POA: Insufficient documentation

## 2024-05-19 DIAGNOSIS — Z3689 Encounter for other specified antenatal screening: Secondary | ICD-10-CM

## 2024-05-19 NOTE — Progress Notes (Signed)
 New OB Intake  I connected with  Krista Hancock on 05/19/24 at  9:15 AM EST by MyChart Video Visit and verified that I am speaking with the correct person using two identifiers. Nurse is located at [facility] and pt is located at home.  I discussed the limitations, risks, security and privacy concerns of performing an evaluation and management service by telephone and the availability of in person appointments. I also discussed with the patient that there may be a patient responsible charge related to this service. The patient expressed understanding and agreed to proceed.  I explained I am completing New OB Intake today. We discussed her EDD of 01/04/25 that is based on LMP of 03/30/24. Pt is G3/P2. I reviewed her allergies, medications, Medical/Surgical/OB history, and appropriate screenings. There are cats in the home: no. Based on history, this is a/an pregnancy uncomplicated . Her obstetrical history is significant for N/A.  Patient Active Problem List   Diagnosis Date Noted   Supervision of other normal pregnancy, antepartum 05/19/2024   Depression 05/19/2024   Family history of alcoholism in father 09/02/2023   History of 2 cesarean sections 09/02/2023   Palpitations 09/01/2023   Bipolar disorder, current episode depressed, severe, without psychotic features (HCC) 01/30/2014   GAD (generalized anxiety disorder) 10/05/2013    Concerns addressed today: None  Delivery Plans:  Plans to deliver at [facility].  Anatomy US  Explained first scheduled US  will be 06/12/24. Anatomy US  will be scheduled around [redacted] weeks gestational age.  Labs Discussed genetic screening with patient. Patient desires genetic testing to be drawn at new OB visit. Discussed possible labs to be drawn at new OB appointment.  COVID Vaccine Patient has had COVID vaccine.   Social Determinants of Health Food Insecurity: denies food insecurity  Transportation: Patient denies transportation  needs. Childcare: Discussed no children allowed at ultrasound appointments.   First visit review I reviewed new OB appt with pt. I explained she will have blood work and pap smear/pelvic exam if indicated. Explained pt will be seen by Eleanor Canny, CNM at first visit; encounter routed to appropriate provider.   Beola Skeens, CMA 05/19/2024  9:28 AM

## 2024-05-19 NOTE — Patient Instructions (Signed)
 First Trimester of Pregnancy  The first trimester of pregnancy starts on the first day of your last monthly period until the end of week 13. This is months 1 through 3 of pregnancy. A week after a sperm fertilizes an egg, the egg will implant into the wall of the uterus and begin to develop into a baby. Body changes during your first trimester Your body goes through many changes during pregnancy. The changes usually return to normal after your baby is born. Physical changes Your breasts may grow larger and may hurt. The area around your nipples may get darker. Your periods will stop. Your hair and nails may grow faster. You may pee more often. Health changes You may tire easily. Your gums may bleed and may be sensitive when you brush and floss. You may not feel hungry. You may have heartburn. You may throw up or feel like you may throw up. You may want to eat some foods, but not others. You may have headaches. You may have trouble pooping (constipation). Other changes Your emotions may change from day to day. You may have more dreams. Follow these instructions at home: Medicines Talk to your health care provider if you're taking medicines. Ask if the medicines are safe to take during pregnancy. Your provider may change the medicines that you take. Do not take any medicines unless told to by your provider. Take a prenatal vitamin that has at least 600 micrograms (mcg) of folic acid. Do not use herbal medicines, illegal substances, or medicines that are not approved by your provider. Eating and drinking While you're pregnant your body needs extra food for your growing baby. Talk with your provider about what to eat while pregnant. Activity Most women are able to exercise during pregnancy. Exercises may need to change as your pregnancy goes on. Talk to your provider about your activities and exercise routines. Relieving pain and discomfort Wear a good, supportive bra if your breasts  hurt. Rest with your legs raised if you have leg cramps or low back pain. Safety Wear your seatbelt at all times when you're in a car. Talk to your provider if someone hits you, hurts you, or yells at you. Talk with your provider if you're feeling sad or have thoughts of hurting yourself. Lifestyle Certain things can be harmful while you're pregnant. Follow these rules: Do not use hot tubs, steam rooms, or saunas. Do not douche. Do not use tampons or scented pads. Do not drink alcohol,smoke, vape, or use products with nicotine or tobacco in them. If you need help quitting, talk with your provider. Avoid cat litter boxes and soil used by cats. These things carry germs that can cause harm to your pregnancy and your baby. General instructions Keep all follow-up visits. It helps you and your unborn baby stay as healthy as possible. Write down your questions. Take them to your visits. Your provider will: Talk with you about your overall health. Give you advice or refer you to specialists who can help with different needs, including: Prenatal education classes. Mental health and counseling. Foods and healthy eating. Ask for help if you need help with food. Call your dentist and ask to be seen. Brush your teeth with a soft toothbrush. Floss gently. Where to find more information American Pregnancy Association: americanpregnancy.org [employer]: acog.org Office on [employer] Health: [employer] Contact a health care provider if: You feel dizzy, faint, or have a fever. You vomit or have watery poop (diarrhea) for 2  days or more. You have abnormal discharge or bleeding from your vagina. You have pain when you pee or your pee smells bad. You have cramps, pain, or pressure in your belly area. Get help right away if: You have trouble breathing or chest pain. You have any kind of injury, such as from a fall or a car crash. These symptoms may be an  emergency. Get help right away. Call 911. Do not wait to see if the symptoms will go away. Do not drive yourself to the hospital. This information is not intended to replace advice given to you by your health care provider. Make sure you discuss any questions you have with your health care provider. Document Revised: 03/25/2023 Document Reviewed: 10/23/2022 Elsevier Patient Education  2024 [employer]Commonly Asked Questions During Pregnancy  Cats: A parasite can be excreted in cat feces.  To avoid exposure you need to have another person empty the little box.  If you must empty the litter box you will need to wear gloves.  Wash your hands after handling your cat.  This parasite can also be found in raw or undercooked meat so this should also be avoided.  Colds, Sore Throats, Flu: Please check your medication sheet to see what you can take for symptoms.  If your symptoms are unrelieved by these medications please call the office.  Dental Work: Most any dental work Agricultural consultant recommends is permitted.  X-rays should only be taken during the first trimester if absolutely necessary.  Your abdomen should be shielded with a lead apron during all x-rays.  Please notify your provider prior to receiving any x-rays.  Novocaine is fine; gas is not recommended.  If your dentist requires a note from [place] prior to dental work please call the office and we will provide one for you.  Exercise: Exercise is an important part of staying healthy during your pregnancy.  You may continue most exercises you were accustomed to prior to pregnancy.  Later in your pregnancy you will most likely notice you have difficulty with activities requiring balance like riding a bicycle.  It is important that you listen to your body and avoid activities that put you at a higher risk of falling.  Adequate rest and staying well hydrated are a must!  If you have questions about the safety of specific activities ask your provider.     Exposure to Children with illness: Try to avoid obvious exposure; report any symptoms to [place] when noted,  If you have chicken pos, red measles or mumps, you should be immune to these diseases.   Please do not take any vaccines while pregnant unless you have checked with your OB provider.  Fetal Movement: After 28 weeks we recommend you do "kick counts" twice daily.  Lie or sit down in a calm quiet environment and count your baby movements "kicks".  You should feel your baby at least 10 times per hour.  If you have not felt 10 kicks within the first hour get up, walk around and have something sweet to eat or drink then repeat for an additional hour.  If count remains less than 10 per hour notify your provider.  Fumigating: Follow your pest control agent's advice as to how long to stay out of your home.  Ventilate the area well before re-entering.  Hemorrhoids:   Most over-the-counter preparations can be used during pregnancy.  Check your medication to see what is safe to use.  It is important to use a  stool softener or fiber in your diet and to drink lots of liquids.  If hemorrhoids seem to be getting worse please call the office.   Hot Tubs:  Hot tubs Jacuzzis and saunas are not recommended while pregnant.  These increase your internal body temperature and should be avoided.  Intercourse:  Sexual intercourse is safe during pregnancy as long as you are comfortable, unless otherwise advised by your provider.  Spotting may occur after intercourse; report any bright red bleeding that is heavier than spotting.  Labor:  If you know that you are in labor, please go to the hospital.  If you are unsure, please call the office and let us help you decide what to do.  Lifting, straining, etc:  If your job requires heavy lifting or straining please check with your provider for any limitations.  Generally, you should not lift items heavier than that you can lift simply with your hands and arms (no back  muscles)  Painting:  Paint fumes do not harm your pregnancy, but may make you ill and should be avoided if possible.  Latex or water based paints have less odor than oils.  Use adequate ventilation while painting.  Permanents & Hair Color:  Chemicals in hair dyes are not recommended as they cause increase hair dryness which can increase hair loss during pregnancy.  " Highlighting" and permanents are allowed.  Dye may be absorbed differently and permanents may not hold as well during pregnancy.  Sunbathing:  Use a sunscreen, as skin burns easily during pregnancy.  Drink plenty of fluids; avoid over heating.  Tanning Beds:  Because their possible side effects are still unknown, tanning beds are not recommended.  Ultrasound Scans:  Routine ultrasounds are performed at approximately 20 weeks.  You will be able to see your baby's general anatomy an if you would like to know the gender this can usually be determined as well.  If it is questionable when you conceived you may also receive an ultrasound early in your pregnancy for dating purposes.  Otherwise ultrasound exams are not routinely performed unless there is a medical necessity.  Although you can request a scan we ask that you pay for it when conducted because insurance does not cover " patient request" scans.  Work: If your pregnancy proceeds without complications you may work until your due date, unless your physician or employer advises otherwise.  Round Ligament Pain/Pelvic Discomfort:  Sharp, shooting pains not associated with bleeding are fairly common, usually occurring in the second trimester of pregnancy.  They tend to be worse when standing up or when you remain standing for long periods of time.  These are the result of pressure of certain pelvic ligaments called "round ligaments".  Rest, Tylenol and heat seem to be the most effective relief.  As the womb and fetus grow, they rise out of the pelvis and the discomfort improves.  Please  notify the office if your pain seems different than that described.  It may represent a more serious condition.  Common Medications Safe in Pregnancy  Acne:      Constipation:  Benzoyl Peroxide     Colace  Clindamycin      Dulcolax Suppository  Topica Erythromycin     Fibercon  Salicylic Acid      Metamucil         Miralax AVOID:        Senakot   Accutane    Cough:  Retin-A       Cough  Drops  Tetracycline      Phenergan w/ Codeine if Rx  Minocycline      Robitussin (Plain & DM)  Antibiotics:     Crabs/Lice:  Ceclor       RID  Cephalosporins    AVOID:  E-Mycins      Kwell  Keflex  Macrobid/Macrodantin   Diarrhea:  Penicillin      Kao-Pectate  Zithromax      Imodium AD         PUSH FLUIDS AVOID:       Cipro     Fever:  Tetracycline      Tylenol (Regular or Extra  Minocycline       Strength)  Levaquin      Extra Strength-Do not          Exceed 8 tabs/24 hrs Caffeine:        200mg /day (equiv. To 1 cup of coffee or  approx. 3 12 oz sodas)         Gas: Cold/Hayfever:       Gas-X  Benadryl      Mylicon  Claritin       Phazyme  **Claritin-D        Chlor-Trimeton    Headaches:  Dimetapp      ASA-Free Excedrin  Drixoral-Non-Drowsy     Cold Compress  Mucinex (Guaifenasin)     Tylenol (Regular or Extra  Sudafed/Sudafed-12 Hour     Strength)  **Sudafed PE Pseudoephedrine   Tylenol Cold & Sinus     Vicks Vapor Rub  Zyrtec  **AVOID if Problems With Blood Pressure         Heartburn: Avoid lying down for at least 1 hour after meals  Aciphex      Maalox     Rash:  Milk of Magnesia     Benadryl    Mylanta       1% Hydrocortisone Cream  Pepcid  Pepcid Complete   Sleep Aids:  Prevacid      Ambien   Prilosec       Benadryl  Rolaids       Chamomile Tea  Tums (Limit 4/day)     Unisom         Tylenol PM         Warm milk-add vanilla or  Hemorrhoids:       Sugar for taste  Anusol/Anusol H.C.  (RX: Analapram 2.5%)  Sugar Substitutes:  Hydrocortisone OTC     Ok in  moderation  Preparation H      Tucks        Vaseline lotion applied to tissue with wiping    Herpes:     Throat:  Acyclovir      Oragel  Famvir  Valtrex     Vaccines:         Flu Shot Leg Cramps:       *Gardasil  Benadryl      Hepatitis A         Hepatitis B Nasal Spray:       Pneumovax  Saline Nasal Spray     Polio Booster         Tetanus Nausea:       Tuberculosis test or PPD  Vitamin B6 25 mg TID   AVOID:    Dramamine      *Gardasil  Emetrol       Live Poliovirus  Ginger Root 250 mg QID    MMR (measles, mumps &  High  Complex Carbs @ Bedtime    rebella)  Sea Bands-Accupressure    Varicella (Chickenpox)  Unisom 1/2 tab TID     *No known complications           If received before Pain:         Known pregnancy;   Darvocet       Resume series after  Lortab        Delivery  Percocet    Yeast:   Tramadol      Femstat  Tylenol 3      Gyne-lotrimin  Ultram       Monistat  Vicodin           MISC:         All Sunscreens           Hair Coloring/highlights          Insect Repellant's          (Including DEET)         Mystic Tans

## 2024-06-07 ENCOUNTER — Other Ambulatory Visit: Payer: Self-pay

## 2024-06-07 DIAGNOSIS — F411 Generalized anxiety disorder: Secondary | ICD-10-CM

## 2024-06-07 MED ORDER — SERTRALINE HCL 25 MG PO TABS: 25.0000 mg | ORAL_TABLET | Freq: Every day | ORAL | 0 refills | Status: DC

## 2024-06-12 ENCOUNTER — Ambulatory Visit

## 2024-06-12 ENCOUNTER — Encounter: Payer: Self-pay | Admitting: Certified Nurse Midwife

## 2024-06-12 DIAGNOSIS — O3680X0 Pregnancy with inconclusive fetal viability, not applicable or unspecified: Secondary | ICD-10-CM

## 2024-06-12 DIAGNOSIS — N912 Amenorrhea, unspecified: Secondary | ICD-10-CM

## 2024-06-23 ENCOUNTER — Encounter: Payer: Self-pay | Admitting: Obstetrics

## 2024-07-05 ENCOUNTER — Encounter: Payer: Self-pay | Admitting: Advanced Practice Midwife

## 2024-07-11 NOTE — Patient Instructions (Signed)
 Second Trimester of Pregnancy  The second trimester of pregnancy is from week 14 through week 27. This is months 4 through 6 of pregnancy. During the second trimester: Morning sickness is less or has stopped. You may have more energy. You may feel hungry more often. At this time, your unborn baby is growing very fast. At the end of the sixth month, the unborn baby may be up to 12 inches long and weigh about 1 pounds. You will likely start to feel the baby move between 16 and 20 weeks of pregnancy. Body changes during your second trimester Your body continues to change during this time. The changes usually go away after your baby is born. Physical changes You will gain more weight. Your belly will get bigger. You may begin to get stretch marks on your hips, belly, and breasts. Your breasts will keep growing and may hurt. You may get dark spots or blotches on your face. A dark line from your belly button to the pubic area may appear. This line is called linea nigra. Your hair may grow faster and get thicker. Health changes You may have headaches. You may have heartburn. You may pee more often. You may have swollen, bulging veins (varicose veins). You may have trouble pooping (constipation), or swollen veins in the butt that can itch or get painful (hemorrhoids). You may have back pain. This is caused by: Weight gain. Pregnancy hormones that are relaxing the joints in your pelvis. Follow these instructions at home: Medicines Talk to your health care provider if you're taking medicines. Ask if the medicines are safe to take during pregnancy. Your provider may change the medicines that you take. Do not take any medicines unless told to by your provider. Take a prenatal vitamin that has at least 600 micrograms (mcg) of folic acid. Do not use herbal medicines, illegal drugs, or medicines that are not approved by your provider. Eating and drinking While you're pregnant your body needs  extra food for your growing baby. Talk with your provider about what to eat while pregnant. Activity Most women are able to exercise during pregnancy. Exercises may need to change as your pregnancy goes on. Talk to your provider about your activities and exercise routines. Relieving pain and discomfort Wear a good, supportive bra if your breasts hurt. Rest with your legs raised if you have leg cramps or low back pain. Take warm sitz baths to soothe pain from hemorrhoids. Use hemorrhoid cream if your provider says it's okay. Do not douche. Do not use tampons or scented pads. Do not use hot tubs, steam rooms, or saunas. Safety Wear your seatbelt at all times when you're in a car. Talk to your provider if someone hits you, hurts you, or yells at you. Talk with your provider if you're feeling sad or have thoughts of hurting yourself. Lifestyle Certain things can be harmful while you're pregnant. It's best to avoid the following: Do not drink alcohol,smoke, vape, or use products with nicotine or tobacco in them. If you need help quitting, talk with your provider. Avoid cat litter boxes and soil used by cats. These things carry germs that can cause harm to your pregnancy and your baby. General instructions Keep all follow-up visits. It helps you and your unborn baby stay as healthy as possible. Write down your questions. Take them to your prenatal visits. Your provider will: Talk with you about your overall health. Give you advice or refer you to specialists who can help with different needs,  including: Prenatal education classes. Mental health and counseling. Foods and healthy eating. Ask for help if you need help with food. Where to find more information American Pregnancy Association: americanpregnancy.org [employer]: acog.org Office on [employer] Health: [employer] Contact a health care provider if: You have a headache that does not go away  when you take medicine. You have any of these problems: You can't eat or drink. You throw up or feel like you may throw up. You have watery poop (diarrhea) for 2 days or more. You have pain when you pee or your pee smells bad. You have been sick for 2 days or more and are not getting better. Contact your provider right away if: You have any of these coming from your vagina: Abnormal discharge. Bad-smelling fluid. Bleeding. Your baby is moving less than usual. You have contractions, belly cramping, or have pain in your pelvis or lower back. You have symptoms of high blood pressure or preeclampsia. These include: A severe, throbbing headache that does not go away. Sudden or extreme swelling of your face, hands, legs, or feet. Vision problems: You see spots. You have blurry vision. Your eyes are sensitive to light. If you can't reach the provider, go to an urgent care or emergency room. Get help right away if: You faint, become confused, or can't think clearly. You have chest pain or trouble breathing. You have any kind of injury, such as from a fall or a car crash. These symptoms may be an emergency. Call 911 right away. Do not wait to see if the symptoms will go away. Do not drive yourself to the hospital. This information is not intended to replace advice given to you by your health care provider. Make sure you discuss any questions you have with your health care provider. Document Revised: 03/25/2023 Document Reviewed: 10/23/2022 Elsevier Patient Education  2024 [employer].

## 2024-07-12 ENCOUNTER — Other Ambulatory Visit (HOSPITAL_COMMUNITY)
Admission: RE | Admit: 2024-07-12 | Discharge: 2024-07-12 | Disposition: A | Discharge status: Home / Self Care | Source: Ambulatory Visit | Attending: Certified Nurse Midwife | Admitting: Certified Nurse Midwife

## 2024-07-12 ENCOUNTER — Ambulatory Visit: Payer: Self-pay | Admitting: Certified Nurse Midwife

## 2024-07-12 ENCOUNTER — Encounter: Payer: Self-pay | Admitting: Certified Nurse Midwife

## 2024-07-12 VITALS — BP 120/76 | HR 102 | Wt 159.2 lb

## 2024-07-12 DIAGNOSIS — Z0283 Encounter for blood-alcohol and blood-drug test: Secondary | ICD-10-CM

## 2024-07-12 DIAGNOSIS — Z113 Encounter for screening for infections with a predominantly sexual mode of transmission: Secondary | ICD-10-CM | POA: Insufficient documentation

## 2024-07-12 DIAGNOSIS — Z3A14 14 weeks gestation of pregnancy: Secondary | ICD-10-CM | POA: Insufficient documentation

## 2024-07-12 DIAGNOSIS — Z362 Encounter for other antenatal screening follow-up: Secondary | ICD-10-CM

## 2024-07-12 DIAGNOSIS — Z1379 Encounter for other screening for genetic and chromosomal anomalies: Secondary | ICD-10-CM

## 2024-07-12 DIAGNOSIS — Z3482 Encounter for supervision of other normal pregnancy, second trimester: Secondary | ICD-10-CM

## 2024-07-12 DIAGNOSIS — Z124 Encounter for screening for malignant neoplasm of cervix: Secondary | ICD-10-CM

## 2024-07-12 DIAGNOSIS — T7589XA Other specified effects of external causes, initial encounter: Secondary | ICD-10-CM

## 2024-07-12 DIAGNOSIS — Z130 Encounter for screening for diseases of the blood and blood-forming organs and certain disorders involving the immune mechanism: Secondary | ICD-10-CM

## 2024-07-12 DIAGNOSIS — Z0184 Encounter for antibody response examination: Secondary | ICD-10-CM

## 2024-07-12 NOTE — Progress Notes (Signed)
 NEW OB HISTORY AND PHYSICAL  SUBJECTIVE:       Krista Hancock is a 34 y.o. G34P2002 female, Patient's last menstrual period was 03/30/2024 (approximate)., Estimated Date of Delivery: 01/04/25, [redacted]w[redacted]d, presents today for establishment of Prenatal Care. She has no unusual complaints   Relationship: married Lives with spouse and her 2 girls Works from home FT Exercise: active with her children Denies smoking, vaping, drugs, and alcohol use   Gynecologic History Patient's last menstrual period was 03/30/2024 (approximate). Normal Contraception: none Last Pap: 12/17/2020. Results were: normal  Obstetric History OB History  Gravida Para Term Preterm AB Living  3 2 2   2   SAB IAB Ectopic Multiple Live Births      2    # Outcome Date GA Lbr Len/2nd Weight Sex Type Anes PTL Lv  3 Current           2 Term 06/23/21 [redacted]w[redacted]d  6 lb 14.8 oz (3.14 kg) F CS-LTranv Spinal  LIV  1 Term 06/03/19 [redacted]w[redacted]d  5 lb 10 oz (2.551 kg) F CS-LTranv   LIV    Past Medical History:  Diagnosis Date   Anxiety    Family history of adverse reaction to anesthesia    mom-nausea   GERD (gastroesophageal reflux disease)    related to pregnancy-no meds   IUGR (intrauterine growth restriction) in prior pregnancy, pregnant     Past Surgical History:  Procedure Laterality Date   APPENDECTOMY     CESAREAN SECTION     CESAREAN SECTION N/A 06/23/2021   Procedure: CESAREAN SECTION ELECTIVE REPEAT;  Surgeon: Janit Alm Agent, MD;  Location: [facility] ORS;  Service: Obstetrics;  Laterality: N/A;   FRACTURE SURGERY Right    hand    Medications Ordered Prior to Encounter[1]  Allergies[2]  Social History   Socioeconomic History   Marital status: Married    Spouse name: Jaidan Stachnik   Number of children: 2   Years of education: Not on file   Highest education level: Some college, no degree  Occupational History   Occupation: Academic Librarian: [employer]  Tobacco Use   Smoking status: Never   Smokeless  tobacco: Never  Vaping Use   Vaping status: Never Used  Substance and Sexual Activity   Alcohol use: Not Currently   Drug use: No   Sexual activity: Yes    Partners: Male    Birth control/protection: None  Other Topics Concern   Not on file  Social History Narrative   Not on file   Social Drivers of Health   Tobacco Use: Low Risk (05/19/2024)   Patient History    Smoking Tobacco Use: Never    Smokeless Tobacco Use: Never    Passive Exposure: Not on file  Financial Resource Strain: Low Risk (05/15/2024)   Overall Financial Resource Strain (CARDIA)    Difficulty of Paying Living Expenses: Not hard at all  Food Insecurity: No Food Insecurity (05/15/2024)   Epic    Worried About Programme Researcher, Broadcasting/film/video in the Last Year: Never true    Ran Out of Food in the Last Year: Never true  Transportation Needs: No Transportation Needs (05/15/2024)   Epic    Lack of Transportation (Medical): No    Lack of Transportation (Non-Medical): No  Physical Activity: Sufficiently Active (05/15/2024)   Exercise Vital Sign    Days of Exercise per Week: 4 days    Minutes of Exercise per Session: 60 min  Stress: No Stress Concern Present (  05/15/2024)   [employer] - Occupational Stress Questionnaire    Feeling of Stress: Only a little  Social Connections: Socially Integrated (05/15/2024)   Social Connection and Isolation Panel    Frequency of Communication with Friends and Family: More than three times a week    Frequency of Social Gatherings with Friends and Family: Twice a week    Attends Religious Services: More than 4 times per year    Active Member of Clubs or Organizations: Yes    Attends Banker Meetings: More than 4 times per year    Marital Status: Married  Catering Manager Violence: Not At Risk (05/19/2024)   Epic    Fear of Current or Ex-Partner: No    Emotionally Abused: No    Physically Abused: No    Sexually Abused: No  Depression (PHQ2-9):  Medium Risk (02/14/2024)   Depression (PHQ2-9)    PHQ-2 Score: 8  Alcohol Screen: Low Risk (05/15/2024)   Alcohol Screen    Last Alcohol Screening Score (AUDIT): 0  Housing: Low Risk (05/15/2024)   Epic    Unable to Pay for Housing in the Last Year: No    Number of Times Moved in the Last Year: 0    Homeless in the Last Year: No  Utilities: Not At Risk (05/15/2024)   Epic    Threatened with loss of utilities: No  Health Literacy: Adequate Health Literacy (05/19/2024)   B1300 Health Literacy    Frequency of need for help with medical instructions: Never    Family History  Problem Relation Age of Onset   Hypertension Mother    Colon polyps Mother    ADD / ADHD Mother    Anxiety disorder Mother    Arthritis Mother    Depression Mother    Heart disease Mother    Hyperlipidemia Mother    Hypertension Father    Alcohol abuse Father    Anxiety disorder Father    Arthritis Father    Depression Father    Heart disease Father    Hyperlipidemia Father    Anxiety disorder Brother    Colon polyps Maternal Grandmother    Hypertension Paternal Grandmother    Stroke Paternal Grandmother    Heart disease Paternal Grandmother    Bladder Cancer Paternal Grandfather     The following portions of the patient's history were reviewed and updated as appropriate: allergies, current medications, past OB history, past medical history, past surgical history, past family history, past social history, and problem list.    OBJECTIVE: Initial Physical Exam (New OB)  GENERAL APPEARANCE: alert, well appearing, in no apparent distress, oriented to person, place and time HEAD: normocephalic, atraumatic MOUTH: mucous membranes moist, pharynx normal without lesions THYROID: no thyromegaly or masses present BREASTS: no masses noted, no significant tenderness, no palpable axillary nodes, no skin changes LUNGS: clear to auscultation, no wheezes, rales or rhonchi, symmetric air entry HEART: regular  rate and rhythm, no murmurs ABDOMEN: soft, nontender, nondistended, no abnormal masses, no epigastric pain and FHT present EXTREMITIES: no redness or tenderness in the calves or thighs, no edema, no limitation in range of motion SKIN: normal coloration and turgor, no rashes LYMPH NODES: no adenopathy palpable NEUROLOGIC: alert, oriented, normal speech, no focal findings or movement disorder noted  PELVIC EXAM deferred pap not due  ASSESSMENT: Normal pregnancy  PLAN: New OB counseling: The patient has been given an overview regarding routine prenatal care. Recommendations regarding diet, weight gain, and exercise in  pregnancy were given. Prenatal testing, optional genetic testing, carrier screening, and ultrasound use in pregnancy were reviewed.  Benefits of Breast Feeding were discussed. The patient is encouraged to consider nursing her baby post partum. She has history of c section x 2 is undecided at this time on route of delivery.  Follow up 4 weeks for ROB and anatomy u/s   [name], CNM     [1]  Current Outpatient Medications on File Prior to Visit  Medication Sig Dispense Refill   Prenatal Vit-Fe Fumarate-FA (PRENATAL PO) Take by mouth.     sertraline  (ZOLOFT ) 25 MG tablet Take 1 tablet (25 mg total) by mouth daily. 30 tablet 0   No current facility-administered medications on file prior to visit.  [2] No Known Allergies

## 2024-07-13 LAB — CBC/D/PLT+RPR+RH+ABO+RUBIGG...
Antibody Screen: NEGATIVE
Basophils Absolute: 0 x10E3/uL (ref 0.0–0.2)
Basos: 0 %
EOS (ABSOLUTE): 0.1 x10E3/uL (ref 0.0–0.4)
Eos: 1 %
HCV Ab: NONREACTIVE
HIV Screen 4th Generation wRfx: NONREACTIVE
Hematocrit: 39.8 % (ref 34.0–46.6)
Hemoglobin: 13.2 g/dL (ref 11.1–15.9)
Hepatitis B Surface Ag: NEGATIVE
Immature Grans (Abs): 0 x10E3/uL (ref 0.0–0.1)
Immature Granulocytes: 0 %
Lymphocytes Absolute: 2.1 x10E3/uL (ref 0.7–3.1)
Lymphs: 14 %
MCH: 31.7 pg (ref 26.6–33.0)
MCHC: 33.2 g/dL (ref 31.5–35.7)
MCV: 95 fL (ref 79–97)
Monocytes Absolute: 0.6 x10E3/uL (ref 0.1–0.9)
Monocytes: 4 %
Neutrophils Absolute: 11.8 x10E3/uL — ABNORMAL HIGH (ref 1.4–7.0)
Neutrophils: 81 %
Platelets: 233 x10E3/uL (ref 150–450)
RBC: 4.17 x10E6/uL (ref 3.77–5.28)
RDW: 12 % (ref 11.7–15.4)
RPR Ser Ql: NONREACTIVE
Rh Factor: POSITIVE
Rubella Antibodies, IGG: 8.05 {index}
Varicella zoster IgG: REACTIVE
WBC: 14.6 x10E3/uL — ABNORMAL HIGH (ref 3.4–10.8)

## 2024-07-13 LAB — URINALYSIS, ROUTINE W REFLEX MICROSCOPIC
Bilirubin, UA: NEGATIVE
Glucose, UA: NEGATIVE
Ketones, UA: NEGATIVE
Leukocytes,UA: NEGATIVE
Nitrite, UA: NEGATIVE
RBC, UA: NEGATIVE
Specific Gravity, UA: 1.026 (ref 1.005–1.030)
Urobilinogen, Ur: 0.2 mg/dL (ref 0.2–1.0)
pH, UA: 6 (ref 5.0–7.5)

## 2024-07-13 LAB — HCV INTERPRETATION

## 2024-07-14 LAB — URINE CULTURE, OB REFLEX

## 2024-07-14 LAB — CERVICOVAGINAL ANCILLARY ONLY
Chlamydia: NEGATIVE
Comment: NEGATIVE
Comment: NORMAL
Neisseria Gonorrhea: NEGATIVE

## 2024-07-14 LAB — MONITOR DRUG PROFILE 14(MW)
Amphetamine Scrn, Ur: NEGATIVE ng/mL
BARBITURATE SCREEN URINE: NEGATIVE ng/mL
BENZODIAZEPINE SCREEN, URINE: NEGATIVE ng/mL
Buprenorphine, Urine: NEGATIVE ng/mL
CANNABINOIDS UR QL SCN: NEGATIVE ng/mL
Cocaine (Metab) Scrn, Ur: NEGATIVE ng/mL
Creatinine(Crt), U: 138.4 mg/dL (ref 20.0–300.0)
Fentanyl, Urine: NEGATIVE pg/mL
Meperidine Screen, Urine: NEGATIVE ng/mL
Methadone Screen, Urine: NEGATIVE ng/mL
OXYCODONE+OXYMORPHONE UR QL SCN: NEGATIVE ng/mL
Opiate Scrn, Ur: NEGATIVE ng/mL
Ph of Urine: 5.7 (ref 4.5–8.9)
Phencyclidine Qn, Ur: NEGATIVE ng/mL
Propoxyphene Scrn, Ur: NEGATIVE ng/mL
SPECIFIC GRAVITY: 1.027
Tramadol Screen, Urine: NEGATIVE ng/mL

## 2024-07-14 LAB — NICOTINE SCREEN, URINE: Cotinine Ql Scrn, Ur: NEGATIVE ng/mL

## 2024-07-14 LAB — CULTURE, OB URINE

## 2024-07-16 ENCOUNTER — Other Ambulatory Visit: Payer: Self-pay | Admitting: Internal Medicine

## 2024-07-16 DIAGNOSIS — F411 Generalized anxiety disorder: Secondary | ICD-10-CM

## 2024-07-17 LAB — MATERNIT 21 PLUS CORE, BLOOD
Fetal Fraction: 18
Result (T21): NEGATIVE
Trisomy 13 (Patau syndrome): NEGATIVE
Trisomy 18 (Edwards syndrome): NEGATIVE
Trisomy 21 (Down syndrome): NEGATIVE

## 2024-08-09 NOTE — Patient Instructions (Signed)
 Second Trimester of Pregnancy: What to Know  The second trimester of pregnancy is from week 14 through week 27. This is months 4 through 6 of pregnancy. During the second trimester: Morning sickness is less or has stopped. You may have more energy. You may feel hungry more often. At this time, your unborn baby is growing very fast. At the end of the sixth month, the unborn baby may be up to 12 inches long and weigh about 1 pounds. You will likely start to feel the baby move between 16 and 20 weeks of pregnancy. Body changes during your second trimester Your body continues to change during this time. The changes usually go away after your baby is born. Physical changes You will gain more weight. Your belly will get bigger. You may begin to get stretch marks on your hips, belly, and breasts. Your breasts will keep growing and may hurt. You may get dark spots or blotches on your face. A dark line from your belly button to the pubic area may appear. This line is called linea nigra. Your hair may grow faster and get thicker. Health changes You may have headaches. You may have heartburn. You may pee more often. You may have swollen, bulging veins (varicose veins). You may have trouble pooping (constipation), or swollen veins in the butt that can itch or get painful (hemorrhoids). You may have back pain. This is caused by: Weight gain. Pregnancy hormones that are relaxing the joints in your pelvis. Follow these instructions at home: Medicines Talk to your health care provider if you're taking medicines. Ask if the medicines are safe to take during pregnancy. Your provider may change the medicines that you take. Do not take any medicines unless told to by your provider. Take a prenatal vitamin that has at least 600 micrograms (mcg) of folic acid. Do not use herbal medicines, illegal drugs, or medicines that are not approved by your provider. Eating and drinking While you're pregnant your  body needs extra food for your growing baby. Talk with your provider about what to eat while pregnant. Activity Most women are able to exercise during pregnancy. Exercises may need to change as your pregnancy goes on. Talk to your provider about your activities and exercise routines. Relieving pain and discomfort Wear a good, supportive bra if your breasts hurt. Rest with your legs raised if you have leg cramps or low back pain. Take warm sitz baths to soothe pain from hemorrhoids. Use hemorrhoid cream if your provider says it's okay. Do not douche. Do not use tampons or scented pads. Do not use hot tubs, steam rooms, or saunas. Safety Wear your seatbelt at all times when you're in a car. Talk to your provider if someone hits you, hurts you, or yells at you. Talk with your provider if you're feeling sad or have thoughts of hurting yourself. Lifestyle Certain things can be harmful while you're pregnant. It's best to avoid the following: Do not drink alcohol,smoke, vape, or use products with nicotine  or tobacco in them. If you need help quitting, talk with your provider. Avoid cat litter boxes and soil used by cats. These things carry germs that can cause harm to your pregnancy and your baby. General instructions Keep all follow-up visits. It helps you and your unborn baby stay as healthy as possible. Write down your questions. Take them to your prenatal visits. Your provider will: Talk with you about your overall health. Give you advice or refer you to specialists who can help  with different needs, including: Prenatal education classes. Mental health and counseling. Foods and healthy eating. Ask for help if you need help with food. Where to find more information American Pregnancy Association: americanpregnancy.org [employer]: acog.org Office on [employer] Health: [place] Contact a health care provider if: You have a headache that does not  go away when you take medicine. You have any of these problems: You can't eat or drink. You throw up or feel like you may throw up. You have watery poop (diarrhea) for 2 days or more. You have pain when you pee or your pee smells bad. You have been sick for 2 days or more and are not getting better. Contact your provider right away if: You have any of these coming from your vagina: Abnormal discharge. Bad-smelling fluid. Bleeding. Your baby is moving less than usual. You have contractions, belly cramping, or have pain in your pelvis or lower back. You have symptoms of high blood pressure or preeclampsia. These include: A severe, throbbing headache that does not go away. Sudden or extreme swelling of your face, hands, legs, or feet. Vision problems: You see spots. You have blurry vision. Your eyes are sensitive to light. If you can't reach the provider, go to an urgent care or emergency room. Get help right away if: You faint, become confused, or can't think clearly. You have chest pain or trouble breathing. You have any kind of injury, such as from a fall or a car crash. These symptoms may be an emergency. Call 911 right away. Do not wait to see if the symptoms will go away. Do not drive yourself to the hospital. This information is not intended to replace advice given to you by your health care provider. Make sure you discuss any questions you have with your health care provider. Document Revised: 05/04/2024 Document Reviewed: 10/23/2022 Elsevier Patient Education  2025 [employer].

## 2024-08-10 ENCOUNTER — Ambulatory Visit: Admitting: Certified Nurse Midwife

## 2024-08-10 ENCOUNTER — Ambulatory Visit

## 2024-08-10 VITALS — BP 128/75 | HR 87 | Wt 163.5 lb

## 2024-08-10 DIAGNOSIS — Z3482 Encounter for supervision of other normal pregnancy, second trimester: Secondary | ICD-10-CM

## 2024-08-10 DIAGNOSIS — Z3A19 19 weeks gestation of pregnancy: Secondary | ICD-10-CM

## 2024-08-10 DIAGNOSIS — Z362 Encounter for other antenatal screening follow-up: Secondary | ICD-10-CM

## 2024-08-10 NOTE — Progress Notes (Signed)
 ROB doing well, feeling good movement. Anatomy u/s today. Reviewed preliminary results. Discussed delivery planing. She has decided to move forward with repeat c section. Discussed MD visit 30 or 32 weeks. She is in agreement. Discussed normal musculoskeletal discomforts in pregnancy and self help measures. Reassurance given . Follow up 4 weeks rob.   Krista Hancock, CNM    ANATOMY ULTRASOUND REPORT  Patient Name: Krista Hancock DOB: [birth year] MRN: [id number] LMP: Patient's last menstrual period was 03/30/2024 (approximate).   Location: [facility] Date of Service: 08/10/2024  Ordering Provider: Zelda Hancock, CNM  Indications:Anatomy Ultrasound Findings:  Krista Hancock intrauterine pregnancy is visualized with FHR at 145 bpm.   Biometrics gives an (U/S) Gestational age of [redacted]w[redacted]d and an (U/S) EDD of 01/01/25; this correlates with the clinically established Estimated Date of Delivery: 01/04/25   Fetal presentation is transverse.  EFW: 289g,10 oz; in the 66.6th percentile.  Placenta: anterior with placental cord insert visualized MVP: 4.50 cm    4CH, rvot, lvot, 3vv, 3vtv, diaphragam, profile, nose/lip, posterior fossa, choroid plexus, lateral ventricle, cavum septi pellucidi, spine, stomach, bladder, three vessel umbilical cord,  kidneys, abdominal cord insert, upper extremities, lower extremities, hands, feet and situs were all visualized and appear normal during todays exam.  Anatomic survey is complete Gender - female.    Cervix appears long and closed measuring 3.38 cm.  Right Ovary: was not identified. Left Ovary: was not identified. Survey of the adnexa demonstrates no adnexal masses.  There is no free peritoneal fluid in the cul de sac.  Impression: 1. [redacted]w[redacted]d Viable Singleton Intrauterine pregnancy by U/S. 2. (U/S) EDD is consistent with Clinically established Estimated Date of Delivery: 01/04/25 . 3. Normal Anatomy Scan  Recommendations: 1.Clinical correlation  with the patient's History and Physical Exam.  Krista Hancock, RDMS (AB,OB,BR),RV

## 2024-09-04 ENCOUNTER — Encounter: Payer: No Typology Code available for payment source | Admitting: Internal Medicine

## 2024-09-06 ENCOUNTER — Encounter: Admitting: Certified Nurse Midwife
